# Patient Record
Sex: Male | Born: 1960 | Race: White | Hispanic: No | Marital: Married | Smoking: Never smoker
Health system: Southern US, Community
[De-identification: ages and names within clinical notes are randomized; demographics above are authoritative.]

## PROBLEM LIST (undated history)

## (undated) DIAGNOSIS — I10 Essential (primary) hypertension: Secondary | ICD-10-CM

## (undated) DIAGNOSIS — E119 Type 2 diabetes mellitus without complications: Secondary | ICD-10-CM

## (undated) DIAGNOSIS — I2510 Atherosclerotic heart disease of native coronary artery without angina pectoris: Secondary | ICD-10-CM

## (undated) DIAGNOSIS — K7460 Unspecified cirrhosis of liver: Secondary | ICD-10-CM

## (undated) HISTORY — DX: Essential (primary) hypertension: I10

## (undated) HISTORY — DX: Unspecified cirrhosis of liver: K74.60

## (undated) HISTORY — DX: Atherosclerotic heart disease of native coronary artery without angina pectoris: I25.10

## (undated) HISTORY — DX: Type 2 diabetes mellitus without complications: E11.9

---

## 2019-07-10 DIAGNOSIS — E113512 Type 2 diabetes mellitus with proliferative diabetic retinopathy with macular edema, left eye: Secondary | ICD-10-CM | POA: Diagnosis not present

## 2019-07-10 DIAGNOSIS — E113411 Type 2 diabetes mellitus with severe nonproliferative diabetic retinopathy with macular edema, right eye: Secondary | ICD-10-CM | POA: Diagnosis not present

## 2019-07-10 DIAGNOSIS — H4912 Fourth [trochlear] nerve palsy, left eye: Secondary | ICD-10-CM | POA: Diagnosis not present

## 2019-07-10 DIAGNOSIS — H4922 Sixth [abducent] nerve palsy, left eye: Secondary | ICD-10-CM | POA: Diagnosis not present

## 2019-07-13 NOTE — Progress Notes (Signed)
Cardiology Office Note:    Date:  07/14/2019   ID:  Corey Silva, DOB [birth year], MRN [id number]  PCP:  Verda Cumins, MD  Cardiologist:  No primary care provider on file.  Electrophysiologist:  None   Referring MD: Verda Cumins, MD   Chief Complaint  Patient presents with  . Chest Pain    History of Present Illness:    Corey Silva is a 59 y.o. male with a hx of CAD, cirrhosis, type 2 diabetes, hypertension,hyperlipidemia who is referred by Dr. Elberta Spaniel for evaluation of chest pain.  He has had 3 stents placed, most recently at the [place] in [place], [place] in 2016/2017.  He reports he was told that he also had an artery that was too small to stent and medical management was recommended.  He states he has always had some chest pain but recently has been worsening.  States that he is having chest pain 1-2 times per week.  Can occur at rest or with exertion.  States that it is left-sided pain, describes as a burning sensation but sometimes with pressure.  Reports it is more intense than he has been having in the past.  Last 5 to 20 minutes and resolved.  He has nitroglycerin but does not take because he has had severe headaches with it.  States that he has not taken nitroglycerin for 16 years.  He is on aspirin and Plavix.  He is also inquiring on a procedure, as was being evaluated for penile pump and would need to hold the Plavix for 14 days.  Past Medical History:  Diagnosis Date  . Cirrhosis ([place])   . Coronary artery disease   . Diabetes mellitus without complication ([place])   . Hypertension       Current Medications: Current Meds  Medication Sig  . aspirin EC 81 MG tablet Take 81 mg by mouth daily.  . busPIRone (BUSPAR) 10 MG tablet Take 10 mg by mouth 2 (two) times daily. 2 tablets by mouth twice daily  . clopidogrel (PLAVIX) 75 MG tablet Take 75 mg by mouth daily. 1 tablet daily  . DULoxetine (CYMBALTA) 60 MG capsule Take 60 mg by mouth daily. Take 2 capsules daily  . gabapentin  (NEURONTIN) 300 MG capsule Take 300 mg by mouth. 4 tablets 3 times daily  . hydrochlorothiazide (HYDRODIURIL) 25 MG tablet Take 25 mg by mouth daily.  . insulin regular (NOVOLIN R) 100 units/mL injection Inject 35 Units into the skin 3 (three) times daily before meals.  Marland Kitchen losartan (COZAAR) 100 MG tablet Take 100 mg by mouth daily.  . metFORMIN (GLUCOPHAGE-XR) 500 MG 24 hr tablet Take 2,000 mg by mouth daily with breakfast.  . omeprazole (PRILOSEC) 20 MG capsule Take 20 mg by mouth daily.  . pregabalin (LYRICA) 225 MG capsule Take 225 mg by mouth 2 (two) times daily. 1 tablet twice dailt  . rosuvastatin (CRESTOR) 40 MG tablet Take 20 mg by mouth daily.  Marland Kitchen SEMAGLUTIDE, 1 MG/DOSE, [place] Inject into the skin.  . Vitamin D, Cholecalciferol, 25 MCG (1000 UT) TABS Take by mouth.  . [DISCONTINUED] losartan-hydrochlorothiazide (HYZAAR) 100-12.5 MG tablet Take 1 tablet by mouth daily. 1 tablet daily     Allergies:   Patient has no allergy information on record.   Social History   Socioeconomic History  . Marital status: Married    Spouse name: Not on file  . Number of children: Not on file  . Years of education: Not on file  .  Highest education level: Not on file  Occupational History  . Not on file  Tobacco Use  . Smoking status: Not on file  Substance and Sexual Activity  . Alcohol use: Not on file  . Drug use: Not on file  . Sexual activity: Not on file  Other Topics Concern  . Not on file  Social History Narrative  . Not on file   Social Determinants of Health   Financial Resource Strain:   . Difficulty of Paying Living Expenses: Not on file  Food Insecurity:   . Worried About Programme researcher, broadcasting/film/video in the Last Year: Not on file  . Ran Out of Food in the Last Year: Not on file  Transportation Needs:   . Lack of Transportation (Medical): Not on file  . Lack of Transportation (Non-Medical): Not on file  Physical Activity:   . Days of Exercise per Week: Not on file  . Minutes of  Exercise per Session: Not on file  Stress:   . Feeling of Stress : Not on file  Social Connections:   . Frequency of Communication with Friends and Family: Not on file  . Frequency of Social Gatherings with Friends and Family: Not on file  . Attends Religious Services: Not on file  . Active Member of Clubs or Organizations: Not on file  . Attends Banker Meetings: Not on file  . Marital Status: Not on file     Family History: No relevant family history ROS:   Please see the history of present illness.     All other systems reviewed and are negative.  EKGs/Labs/Other Studies Reviewed:    The following studies were reviewed today:  EKG:  EKG is ordered today.  The ekg ordered today demonstrates normal sinus rhythm, LVH, rate 90, nonspecific T wave flattening.  Recent Labs: No results found for requested labs within last 8760 hours.  Recent Lipid Panel No results found for: CHOL, TRIG, HDL, CHOLHDL, VLDL, LDLCALC, LDLDIRECT  Physical Exam:    VS:  BP (!) 142/76   Pulse 90   Temp 98.4 F (36.9 C)   Ht 6\' 1"  (1.854 m)   Wt 262 lb 12.8 oz (119.2 kg)   SpO2 95%   BMI 34.67 kg/m     Wt Readings from Last 3 Encounters:  07/14/19 262 lb 12.8 oz (119.2 kg)     GEN:  Well nourished, well developed in no acute distress HEENT: Normal NECK: No JVD LYMPHATICS: No lymphadenopathy CARDIAC: RRR, 2/6 systolic murmur RESPIRATORY:  Clear to auscultation without rales, wheezing or rhonchi  ABDOMEN: Soft, non-tender, non-distended MUSCULOSKELETAL:  No edema; No deformity  SKIN: Warm and dry NEUROLOGIC:  Alert and oriented x 3 PSYCHIATRIC:  Normal affect   ASSESSMENT:    1. Chest pain, unspecified type   2. 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 Essential hypertension   4. Hyperlipidemia, unspecified hyperlipidemia type    PLAN:    In order of problems listed above:  CAD: Status post 3 stents.   Will obtain records from last catheterization in 09/13/19.  Currently having atypical angina. -Lexiscan Myoview -Continue ASA 81 mg,Plavix 75 mg daily, rosuvastatin 20 mg daily -We will add metoprolol 25 mg twice daily -SL NTG  Pre-op evaluation:prior to penile pump, was told would need to hold Plavix for 14 days prior to procedure.  Given active chest pain as above, would hold off on procedure until work-up  is complete.  We will [facility] for [facility] as above.  If no significant perfusion defect on Myoview, can hold Plavix and proceed with procedure.  Given his history of coronary stents,  Aspirin 81 mg should not be held for the procedure.  Hypertension: On hydrochlorothiazide 25 mg, losartan 100 mg daily.  Mildly elevated BP in clinic today, will add metoprolol as above  Type 2 diabetes: On insulin.  A1c 6.9 on 06/04/2019  Hyperlipidemia: On rosuvastatin 20 mg daily.  LDL 44 on 05/29/2019  RTC in 1 month  Medication Adjustments/Labs and Tests Ordered: Current medicines are reviewed at length with the patient today.  Concerns regarding medicines are outlined above.  Orders Placed This Encounter  Procedures  . MYOCARDIAL PERFUSION IMAGING  . EKG 12-Lead  . ECHOCARDIOGRAM COMPLETE   Meds ordered this encounter  Medications  . metoprolol tartrate (LOPRESSOR) 25 MG tablet    Sig: Take 1 tablet (25 mg total) by mouth 2 (two) times daily.    Dispense:  180 tablet    Refill:  3    Patient Instructions  Medication Instructions:  START metoprolol tartrate (Lopressor) 25 mg two times daily  *If you need a refill on your cardiac medications before your next appointment, please call your pharmacy*   Lab Work: NONE  Testing/Procedures: Your physician has requested that you have an echocardiogram. Echocardiography is a painless test that uses sound waves to create images of your heart. It provides your doctor with information about the size and shape of your heart and how well your  heart's chambers and valves are working. This procedure takes approximately one hour. There are no restrictions for this procedure.  This will be done at our [facility] location:  [address]  Your physician has requested that you have a lexiscan myoview. For further information please visit [email or web address] Please follow instruction sheet, as given.   Follow-Up: At [facility], you and your health needs are our priority.  As part of our continuing mission to provide you with exceptional heart care, we have created designated Provider Care Teams.  These Care Teams include your primary Cardiologist (physician) and Advanced Practice Providers (APPs -  Physician Assistants and Nurse Practitioners) who all work together to provide you with the care you need, when you need it.  We recommend signing up for the patient portal called "MyChart".  Sign up information is provided on this After Visit Summary.  MyChart is used to connect with patients for Virtual Visits (Telemedicine).  Patients are able to view lab/test results, encounter notes, upcoming appointments, etc.  Non-urgent messages can be sent to your provider as well.   To learn more about what you can do with MyChart, go to [email or web address].    Your next appointment:   1 month(s)  The format for your next appointment:   In Person  Provider:   Epifanio Lesches, MD        Signed, Little Ishikawa, MD  07/14/2019 11:04 AM    [facility]

## 2019-07-14 ENCOUNTER — Other Ambulatory Visit: Payer: Self-pay

## 2019-07-14 ENCOUNTER — Encounter: Payer: Self-pay | Admitting: Cardiology

## 2019-07-14 ENCOUNTER — Ambulatory Visit (INDEPENDENT_AMBULATORY_CARE_PROVIDER_SITE_OTHER): Payer: No Typology Code available for payment source | Admitting: Cardiology

## 2019-07-14 VITALS — BP 142/76 | HR 90 | Temp 98.4°F | Ht 73.0 in | Wt 262.8 lb

## 2019-07-14 DIAGNOSIS — I10 Essential (primary) hypertension: Secondary | ICD-10-CM | POA: Diagnosis not present

## 2019-07-14 DIAGNOSIS — R079 Chest pain, unspecified: Secondary | ICD-10-CM

## 2019-07-14 DIAGNOSIS — I25119 Atherosclerotic heart disease of native coronary artery with unspecified angina pectoris: Secondary | ICD-10-CM

## 2019-07-14 DIAGNOSIS — E785 Hyperlipidemia, unspecified: Secondary | ICD-10-CM

## 2019-07-14 MED ORDER — METOPROLOL TARTRATE 25 MG PO TABS: 25.0000 mg | ORAL_TABLET | Freq: Two times a day (BID) | ORAL | 3 refills | Status: DC

## 2019-07-14 NOTE — Patient Instructions (Signed)
Medication Instructions:  START metoprolol tartrate (Lopressor) 25 mg two times daily  *If you need a refill on your cardiac medications before your next appointment, please call your pharmacy*   Lab Work: NONE  Testing/Procedures: Your physician has requested that you have an echocardiogram. Echocardiography is a painless test that uses sound waves to create images of your heart. It provides your doctor with information about the size and shape of your heart and how well your heart's chambers and valves are working. This procedure takes approximately one hour. There are no restrictions for this procedure.  This will be done at our [facility] location:  [address]  Your physician has requested that you have a lexiscan myoview. For further information please visit [email or web address] Please follow instruction sheet, as given.   Follow-Up: At [facility], you and your health needs are our priority.  As part of our continuing mission to provide you with exceptional heart care, we have created designated Provider Care Teams.  These Care Teams include your primary Cardiologist (physician) and Advanced Practice Providers (APPs -  Physician Assistants and Nurse Practitioners) who all work together to provide you with the care you need, when you need it.  We recommend signing up for the patient portal called "MyChart".  Sign up information is provided on this After Visit Summary.  MyChart is used to connect with patients for Virtual Visits (Telemedicine).  Patients are able to view lab/test results, encounter notes, upcoming appointments, etc.  Non-urgent messages can be sent to your provider as well.   To learn more about what you can do with MyChart, go to [email or web address].    Your next appointment:   1 month(s)  The format for your next appointment:   In Person  Provider:   Epifanio Lesches, MD

## 2019-07-18 ENCOUNTER — Telehealth (HOSPITAL_COMMUNITY): Payer: Self-pay

## 2019-07-18 NOTE — Telephone Encounter (Signed)
Encounter complete. 

## 2019-07-23 ENCOUNTER — Telehealth: Payer: Self-pay | Admitting: Cardiology

## 2019-07-23 ENCOUNTER — Other Ambulatory Visit: Payer: Self-pay

## 2019-07-23 ENCOUNTER — Ambulatory Visit (HOSPITAL_COMMUNITY)
Admission: RE | Admit: 2019-07-23 | Discharge: 2019-07-23 | Disposition: A | Discharge status: Home / Self Care | Payer: No Typology Code available for payment source | Source: Ambulatory Visit | Attending: Cardiovascular Disease | Admitting: Cardiovascular Disease

## 2019-07-23 DIAGNOSIS — R079 Chest pain, unspecified: Secondary | ICD-10-CM | POA: Diagnosis not present

## 2019-07-23 LAB — MYOCARDIAL PERFUSION IMAGING
LV dias vol: 116 mL (ref 62–150)
LV sys vol: 52 mL
Peak HR: 104 {beats}/min
Rest HR: 78 {beats}/min
SDS: 4
SRS: 2
SSS: 5
TID: 1.1

## 2019-07-23 MED ORDER — TECHNETIUM TC 99M TETROFOSMIN IV KIT
10.1000 | PACK | Freq: Once | INTRAVENOUS | Status: AC | PRN
  Administered 2019-07-23: 10.1 via INTRAVENOUS
  Filled 2019-07-23: qty 11

## 2019-07-23 MED ORDER — REGADENOSON 0.4 MG/5ML IV SOLN
0.4000 mg | Freq: Once | INTRAVENOUS | Status: AC
  Administered 2019-07-23: 0.4 mg via INTRAVENOUS

## 2019-07-23 MED ORDER — TECHNETIUM TC 99M TETROFOSMIN IV KIT
31.6000 | PACK | Freq: Once | INTRAVENOUS | Status: AC | PRN
  Administered 2019-07-23: 31.6 via INTRAVENOUS
  Filled 2019-07-23: qty 32

## 2019-07-23 NOTE — Telephone Encounter (Signed)
Encounter not needed

## 2019-07-23 NOTE — Telephone Encounter (Signed)
Returned call to patient no answer.LMTC. 

## 2019-07-23 NOTE — Telephone Encounter (Signed)
Patient states he is calling to discuss instructions for Myocardial Perfusion scheduled for today, 07/23/19 at 1:15 PM. Please return call to advise.

## 2019-07-24 ENCOUNTER — Telehealth: Payer: Self-pay | Admitting: Cardiology

## 2019-07-24 NOTE — Telephone Encounter (Signed)
Patient returning Hayley's call in regards to stress test results. Please call mobile number.

## 2019-07-25 NOTE — Telephone Encounter (Signed)
See result note-OV 3/26

## 2019-07-28 NOTE — Progress Notes (Addendum)
Cardiology Office Note:    Date:  08/04/2019   ID:  Corey Silva, DOB [birth year], MRN [id number]  PCP:  Erick Alley, MD  Cardiologist:  No primary care provider on file.  Electrophysiologist:  None   Referring MD: Erick Alley, MD   Chief Complaint  Patient presents with  . Chest Pain    History of Present Illness:    Corey Silva is a 59 y.o. male with a hx of CAD, cirrhosis, type 2 diabetes, hypertension,hyperlipidemia who is referred by Dr. Nelva Nay for evaluation of chest pain.  He has had 3 stents placed, most recently at the [place] in [place], [place] in 2016/2017.  He reports he was told that he also had an artery that was too small to stent and medical management was recommended.  He states he has always had some chest pain but recently has been worsening.  States that he is having chest pain 1-2 times per week.  Can occur at rest or with exertion.  States that it is left-sided pain, describes as a burning sensation but sometimes with pressure.  Reports it is more intense than he has been having in the past.  Last 5 to 20 minutes and resolved.  He has nitroglycerin but does not take because he has had severe headaches with it.  States that he has not taken nitroglycerin for 16 years.  He is on aspirin and Plavix.  He is also inquiring on a procedure, as was being evaluated for penile pump and would need to hold the Plavix for 14 days.  Cath 11/2013 showed 60% in-stent restenosis in proximal LAD, 20% in-stent restenosis in D1, 90% stenosis in distal RCA.  S/p DES to distal RCA.  Lexiscan Myoview on 07/23/2019 showed basal to mid anterior ischemia.  Since last clinic visit, continues to have chest pain 1-2 times per week, no changes since prior visit.   Past Medical History:  Diagnosis Date  . Cirrhosis (HCC)   . Coronary artery disease   . Diabetes mellitus without complication (HCC)   . Hypertension       Current Medications: Current Meds  Medication Sig  . aspirin EC 81 MG  tablet Take 81 mg by mouth daily.  . busPIRone (BUSPAR) 10 MG tablet Take 10 mg by mouth 2 (two) times daily. 2 tablets by mouth twice daily  . clopidogrel (PLAVIX) 75 MG tablet Take 75 mg by mouth daily. 1 tablet daily  . DULoxetine (CYMBALTA) 60 MG capsule Take 60 mg by mouth daily. Take 2 capsules daily  . gabapentin (NEURONTIN) 300 MG capsule Take 300 mg by mouth. 4 tablets 3 times daily  . hydrochlorothiazide (HYDRODIURIL) 25 MG tablet Take 25 mg by mouth daily.  . insulin regular (NOVOLIN R) 100 units/mL injection Inject 35 Units into the skin 3 (three) times daily before meals.  Marland Kitchen losartan (COZAAR) 100 MG tablet Take 100 mg by mouth daily.  . metFORMIN (GLUCOPHAGE-XR) 500 MG 24 hr tablet Take 2,000 mg by mouth daily with breakfast.  . metoprolol tartrate (LOPRESSOR) 50 MG tablet Take 1 tablet (50 mg total) by mouth 2 (two) times daily.  Marland Kitchen omeprazole (PRILOSEC) 20 MG capsule Take 20 mg by mouth daily.  . pregabalin (LYRICA) 225 MG capsule Take 225 mg by mouth 2 (two) times daily. 1 tablet twice dailt  . rosuvastatin (CRESTOR) 40 MG tablet Take 20 mg by mouth daily.  Marland Kitchen SEMAGLUTIDE, 1 MG/DOSE, [place] Inject into the skin.  . Vitamin D, Cholecalciferol, 25  MCG (1000 UT) TABS Take by mouth.  . [DISCONTINUED] metoprolol tartrate (LOPRESSOR) 25 MG tablet Take 1 tablet (25 mg total) by mouth 2 (two) times daily.     Allergies:   Sm non-asprin sinus [pseudoephedrine-acetaminophen]   Social History   Socioeconomic History  . Marital status: Married    Spouse name: Not on file  . Number of children: Not on file  . Years of education: Not on file  . Highest education level: Not on file  Occupational History  . Not on file  Tobacco Use  . Smoking status: Never Smoker  . Smokeless tobacco: Never Used  Substance and Sexual Activity  . Alcohol use: Not Currently  . Drug use: Never  . Sexual activity: Yes  Other Topics Concern  . Not on file  Social History Narrative  . Not on file    Social Determinants of Health   Financial Resource Strain:   . Difficulty of Paying Living Expenses:   Food Insecurity:   . Worried About Programme researcher, broadcasting/film/video in the Last Year:   . Barista in the Last Year:   Transportation Needs:   . Freight forwarder (Medical):   Marland Kitchen Lack of Transportation (Non-Medical):   Physical Activity:   . Days of Exercise per Week:   . Minutes of Exercise per Session:   Stress:   . Feeling of Stress :   Social Connections:   . Frequency of Communication with Friends and Family:   . Frequency of Social Gatherings with Friends and Family:   . Attends Religious Services:   . Active Member of Clubs or Organizations:   . Attends Banker Meetings:   Marland Kitchen Marital Status:      Family History: No relevant family history ROS:   Please see the history of present illness.     All other systems reviewed and are negative.  EKGs/Labs/Other Studies Reviewed:    The following studies were reviewed today:  EKG:  EKG is ordered today.  The ekg ordered today demonstrates normal sinus rhythm, LVH, rate 77, nonspecific T wave flattening.  Lexiscan Myoview 07/23/2019:  The left ventricular ejection fraction is normal (55-65%).  Nuclear stress EF: 55%.  There was no ST segment deviation noted during stress.  No T wave inversion was noted during stress.  Findings consistent with ischemia.  This is an intermediate risk study.   Impression:  1. Diaphragm attenuation artifact.  2. There is a medium size (6-8%), moderate perfusion defect present in the basal to mid anterior wall consistent with ischemia.  3. No evidence of prior infarction.  4. Normal LVEF, 55%.  5. This is an intermediate risk study given the patient's history of CAD/PCI.   Recent Labs: 08/01/2019: BUN 26; Creatinine, Ser 0.85; Hemoglobin 15.3; Platelets 150; Potassium 4.5; Sodium 140  Recent Lipid Panel No results found for: CHOL, TRIG, HDL, CHOLHDL, VLDL, LDLCALC,  LDLDIRECT  Physical Exam:    VS:  BP 134/74   Pulse 77   Temp (!) 97.2 F (36.2 C)   Ht 6\' 1"  (1.854 m)   Wt 256 lb (116.1 kg)   SpO2 95%   BMI 33.78 kg/m     Wt Readings from Last 3 Encounters:  08/01/19 256 lb (116.1 kg)  07/23/19 262 lb (118.8 kg)  07/14/19 262 lb 12.8 oz (119.2 kg)     GEN:  Well nourished, well developed in no acute distress HEENT: Normal NECK: No JVD LYMPHATICS: No lymphadenopathy CARDIAC:  RRR, 2/6 systolic murmur RESPIRATORY:  Clear to auscultation without rales, wheezing or rhonchi  ABDOMEN: Soft, non-tender, non-distended MUSCULOSKELETAL:  No edema; No deformity  SKIN: Warm and dry NEUROLOGIC:  Alert and oriented x 3 PSYCHIATRIC:  Normal affect   ASSESSMENT:    1. Coronary artery disease involving native coronary artery of native heart with angina pectoris ([place])   2. Abnormal nuclear stress test   3. Pre-procedure lab exam   4. Essential hypertension   5. Hyperlipidemia, unspecified hyperlipidemia type    PLAN:     CAD: Has had 3 stents, most recent cath in [place] showed  60% in-stent restenosis in proximal LAD, 20% in-stent restenosis in D1, 90% stenosis in distal RCA s/p DES to distal RCA.  Currently having atypical angina.  Lexiscan Myoview 07/23/2019 shows anterior ischemia -Continue ASA 81 mg,Plavix 75 mg daily, rosuvastatin 20 mg daily -Increase metoprolol to 50 mg twice daily -SL NTG prn -Recommend LHC.  Risks and benefits of cardiac catheterization have been discussed with the patient.  These include bleeding, infection, kidney damage, stroke, heart attack, death.  The patient understands these risks and is willing to proceed.  Hypertension: appears controlled, continue current meds  Type 2 diabetes: On insulin.  A1c 6.9 on 06/04/2019  Hyperlipidemia: On rosuvastatin 20 mg daily.  LDL 44 on 05/29/2019  RTC in 1 month  Medication Adjustments/Labs and Tests Ordered: Current medicines are reviewed at length with the patient  today.  Concerns regarding medicines are outlined above.  Orders Placed This Encounter  Procedures  . Basic metabolic panel  . CBC   Meds ordered this encounter  Medications  . metoprolol tartrate (LOPRESSOR) 50 MG tablet    Sig: Take 1 tablet (50 mg total) by mouth 2 (two) times daily.    Dispense:  180 tablet    Refill:  3  . nitroGLYCERIN (NITROSTAT) 0.4 MG SL tablet    Sig: Place 1 tablet (0.4 mg total) under the tongue every 5 (five) minutes as needed for chest pain.    Dispense:  25 tablet    Refill:  3    Patient Instructions  Medication Instructions:  INCREASE metoprolol tartrate (Lopressor) to 50 mg two times daily  Take sublingual NTG as needed for chest pain  *If you need a refill on your cardiac medications before your next appointment, please call your pharmacy*   Lab Work: BMET, CBC today If you have labs (blood work) drawn today and your tests are completely normal, you will receive your results only by: Marland Kitchen MyChart Message (if you have MyChart) OR . A paper copy in the mail If you have any lab test that is abnormal or we need to change your treatment, we will call you to review the results.  COVID TEST TOMORROW 3/27 at 9 AM at [facility]  Testing/Procedures: Your physician has requested that you have a cardiac catheterization. Cardiac catheterization is used to diagnose and/or treat various heart conditions. Doctors may recommend this procedure for a number of different reasons. The most common reason is to evaluate chest pain. Chest pain can be a symptom of coronary artery disease (CAD), and cardiac catheterization can show whether plaque is narrowing or blocking your heart's arteries. This procedure is also used to evaluate the valves, as well as measure the blood flow and oxygen levels in different parts of your heart. For further information please visit [email or web address]. Please follow instruction sheet, as given.   Follow-Up: At [facility], you and  your health  needs are our priority.  As part of our continuing mission to provide you with exceptional heart care, we have created designated Provider Care Teams.  These Care Teams include your primary Cardiologist (physician) and Advanced Practice Providers (APPs -  Physician Assistants and Nurse Practitioners) who all work together to provide you with the care you need, when you need it.  We recommend signing up for the patient portal called "MyChart".  Sign up information is provided on this After Visit Summary.  MyChart is used to connect with patients for Virtual Visits (Telemedicine).  Patients are able to view lab/test results, encounter notes, upcoming appointments, etc.  Non-urgent messages can be sent to your provider as well.   To learn more about what you can do with MyChart, go to [email or web address].    Your next appointment:   As scheduled 4/13 with Dr. Bjorn Pippin    Signed, Little Ishikawa, MD  08/04/2019 8:47 AM     [facility]

## 2019-07-28 NOTE — H&P (View-Only) (Signed)
Cardiology Office Note:    Date:  08/04/2019   ID:  Corey Silva, DOB 06-02-1960, MRN [id number]  PCP:  Erick Alley, MD  Cardiologist:  No primary care provider on file.  Electrophysiologist:  None   Referring MD: Erick Alley, MD   Chief Complaint  Patient presents with  . Chest Pain    History of Present Illness:    Corey Silva is a 59 y.o. male with a hx of CAD, cirrhosis, type 2 diabetes, hypertension,hyperlipidemia who is referred by Dr. Nelva Nay for evaluation of chest pain.  He has had 3 stents placed, most recently at the [place] in [place], [place] in 2016/2017.  He reports he was told that he also had an artery that was too small to stent and medical management was recommended.  He states he has always had some chest pain but recently has been worsening.  States that he is having chest pain 1-2 times per week.  Can occur at rest or with exertion.  States that it is left-sided pain, describes as a burning sensation but sometimes with pressure.  Reports it is more intense than he has been having in the past.  Last 5 to 20 minutes and resolved.  He has nitroglycerin but does not take because he has had severe headaches with it.  States that he has not taken nitroglycerin for 16 years.  He is on aspirin and Plavix.  He is also inquiring on a procedure, as was being evaluated for penile pump and would need to hold the Plavix for 14 days.  Cath 11/2013 showed 60% in-stent restenosis in proximal LAD, 20% in-stent restenosis in D1, 90% stenosis in distal RCA.  S/p DES to distal RCA.  Lexiscan Myoview on 07/23/2019 showed basal to mid anterior ischemia.  Since last clinic visit, continues to have chest pain 1-2 times per week, no changes since prior visit.   Past Medical History:  Diagnosis Date  . Cirrhosis (HCC)   . Coronary artery disease   . Diabetes mellitus without complication (HCC)   . Hypertension       Current Medications: Current Meds  Medication Sig  . aspirin EC 81 MG  tablet Take 81 mg by mouth daily.  . busPIRone (BUSPAR) 10 MG tablet Take 10 mg by mouth 2 (two) times daily. 2 tablets by mouth twice daily  . clopidogrel (PLAVIX) 75 MG tablet Take 75 mg by mouth daily. 1 tablet daily  . DULoxetine (CYMBALTA) 60 MG capsule Take 60 mg by mouth daily. Take 2 capsules daily  . gabapentin (NEURONTIN) 300 MG capsule Take 300 mg by mouth. 4 tablets 3 times daily  . hydrochlorothiazide (HYDRODIURIL) 25 MG tablet Take 25 mg by mouth daily.  . insulin regular (NOVOLIN R) 100 units/mL injection Inject 35 Units into the skin 3 (three) times daily before meals.  Marland Kitchen losartan (COZAAR) 100 MG tablet Take 100 mg by mouth daily.  . metFORMIN (GLUCOPHAGE-XR) 500 MG 24 hr tablet Take 2,000 mg by mouth daily with breakfast.  . metoprolol tartrate (LOPRESSOR) 50 MG tablet Take 1 tablet (50 mg total) by mouth 2 (two) times daily.  Marland Kitchen omeprazole (PRILOSEC) 20 MG capsule Take 20 mg by mouth daily.  . pregabalin (LYRICA) 225 MG capsule Take 225 mg by mouth 2 (two) times daily. 1 tablet twice dailt  . rosuvastatin (CRESTOR) 40 MG tablet Take 20 mg by mouth daily.  Marland Kitchen SEMAGLUTIDE, 1 MG/DOSE, [place] Inject into the skin.  . Vitamin D, Cholecalciferol, 25  MCG (1000 UT) TABS Take by mouth.  . [DISCONTINUED] metoprolol tartrate (LOPRESSOR) 25 MG tablet Take 1 tablet (25 mg total) by mouth 2 (two) times daily.     Allergies:   Sm non-asprin sinus [pseudoephedrine-acetaminophen]   Social History   Socioeconomic History  . Marital status: Married    Spouse name: Not on file  . Number of children: Not on file  . Years of education: Not on file  . Highest education level: Not on file  Occupational History  . Not on file  Tobacco Use  . Smoking status: Never Smoker  . Smokeless tobacco: Never Used  Substance and Sexual Activity  . Alcohol use: Not Currently  . Drug use: Never  . Sexual activity: Yes  Other Topics Concern  . Not on file  Social History Narrative  . Not on file    Social Determinants of Health   Financial Resource Strain:   . Difficulty of Paying Living Expenses:   Food Insecurity:   . Worried About Charity fundraiser in the Last Year:   . Arboriculturist in the Last Year:   Transportation Needs:   . Film/video editor (Medical):   Marland Kitchen Lack of Transportation (Non-Medical):   Physical Activity:   . Days of Exercise per Week:   . Minutes of Exercise per Session:   Stress:   . Feeling of Stress :   Social Connections:   . Frequency of Communication with Friends and Family:   . Frequency of Social Gatherings with Friends and Family:   . Attends Religious Services:   . Active Member of Clubs or Organizations:   . Attends Archivist Meetings:   Marland Kitchen Marital Status:      Family History: No relevant family history ROS:   Please see the history of present illness.     All other systems reviewed and are negative.  EKGs/Labs/Other Studies Reviewed:    The following studies were reviewed today:  EKG:  EKG is ordered today.  The ekg ordered today demonstrates normal sinus rhythm, LVH, rate 90, nonspecific T wave flattening.  Lexiscan Myoview 07/23/2019:  The left ventricular ejection fraction is normal (55-65%).  Nuclear stress EF: 55%.  There was no ST segment deviation noted during stress.  No T wave inversion was noted during stress.  Findings consistent with ischemia.  This is an intermediate risk study.   Impression:  1. Diaphragm attenuation artifact.  2. There is a medium size (6-8%), moderate perfusion defect present in the basal to mid anterior wall consistent with ischemia.  3. No evidence of prior infarction.  4. Normal LVEF, 55%.  5. This is an intermediate risk study given the patient's history of CAD/PCI.   Recent Labs: 08/01/2019: BUN 26; Creatinine, Ser 0.85; Hemoglobin 15.3; Platelets 150; Potassium 4.5; Sodium 140  Recent Lipid Panel No results found for: CHOL, TRIG, HDL, CHOLHDL, VLDL, LDLCALC,  LDLDIRECT  Physical Exam:    VS:  BP 134/74   Pulse 77   Temp (!) 97.2 F (36.2 C)   Ht 6\' 1"  (1.854 m)   Wt 256 lb (116.1 kg)   SpO2 95%   BMI 33.78 kg/m     Wt Readings from Last 3 Encounters:  08/01/19 256 lb (116.1 kg)  07/23/19 262 lb (118.8 kg)  07/14/19 262 lb 12.8 oz (119.2 kg)     GEN:  Well nourished, well developed in no acute distress HEENT: Normal NECK: No JVD LYMPHATICS: No lymphadenopathy CARDIAC:  RRR, 2/6 systolic murmur RESPIRATORY:  Clear to auscultation without rales, wheezing or rhonchi  ABDOMEN: Soft, non-tender, non-distended MUSCULOSKELETAL:  No edema; No deformity  SKIN: Warm and dry NEUROLOGIC:  Alert and oriented x 3 PSYCHIATRIC:  Normal affect   ASSESSMENT:    1. Coronary artery disease involving native coronary artery of native heart with angina pectoris ([place])   2. Abnormal nuclear stress test   3. Pre-procedure lab exam   4. Essential hypertension   5. Hyperlipidemia, unspecified hyperlipidemia type    PLAN:     CAD: Has had 3 stents, most recent cath in [place] showed  60% in-stent restenosis in proximal LAD, 20% in-stent restenosis in D1, 90% stenosis in distal RCA s/p DES to distal RCA.  Currently having atypical angina.  Lexiscan Myoview 07/23/2019 shows anterior ischemia -Continue ASA 81 mg,Plavix 75 mg daily, rosuvastatin 20 mg daily -Increase metoprolol to 50 mg twice daily -SL NTG prn -Recommend LHC.  Risks and benefits of cardiac catheterization have been discussed with the patient.  These include bleeding, infection, kidney damage, stroke, heart attack, death.  The patient understands these risks and is willing to proceed.  Hypertension: appears controlled, continue current meds  Type 2 diabetes: On insulin.  A1c 6.9 on 06/04/2019  Hyperlipidemia: On rosuvastatin 20 mg daily.  LDL 44 on 05/29/2019  RTC in 1 month  Medication Adjustments/Labs and Tests Ordered: Current medicines are reviewed at length with the patient  today.  Concerns regarding medicines are outlined above.  Orders Placed This Encounter  Procedures  . Basic metabolic panel  . CBC   Meds ordered this encounter  Medications  . metoprolol tartrate (LOPRESSOR) 50 MG tablet    Sig: Take 1 tablet (50 mg total) by mouth 2 (two) times daily.    Dispense:  180 tablet    Refill:  3  . nitroGLYCERIN (NITROSTAT) 0.4 MG SL tablet    Sig: Place 1 tablet (0.4 mg total) under the tongue every 5 (five) minutes as needed for chest pain.    Dispense:  25 tablet    Refill:  3    Patient Instructions  Medication Instructions:  INCREASE metoprolol tartrate (Lopressor) to 50 mg two times daily  Take sublingual NTG as needed for chest pain  *If you need a refill on your cardiac medications before your next appointment, please call your pharmacy*   Lab Work: BMET, CBC today If you have labs (blood work) drawn today and your tests are completely normal, you will receive your results only by: Marland Kitchen MyChart Message (if you have MyChart) OR . A paper copy in the mail If you have any lab test that is abnormal or we need to change your treatment, we will call you to review the results.  COVID TEST TOMORROW 3/27 at 9 AM at [facility]  Testing/Procedures: Your physician has requested that you have a cardiac catheterization. Cardiac catheterization is used to diagnose and/or treat various heart conditions. Doctors may recommend this procedure for a number of different reasons. The most common reason is to evaluate chest pain. Chest pain can be a symptom of coronary artery disease (CAD), and cardiac catheterization can show whether plaque is narrowing or blocking your heart's arteries. This procedure is also used to evaluate the valves, as well as measure the blood flow and oxygen levels in different parts of your heart. For further information please visit [email or web address]. Please follow instruction sheet, as given.   Follow-Up: At [facility], you and  your health  needs are our priority.  As part of our continuing mission to provide you with exceptional heart care, we have created designated Provider Care Teams.  These Care Teams include your primary Cardiologist (physician) and Advanced Practice Providers (APPs -  Physician Assistants and Nurse Practitioners) who all work together to provide you with the care you need, when you need it.  We recommend signing up for the patient portal called "MyChart".  Sign up information is provided on this After Visit Summary.  MyChart is used to connect with patients for Virtual Visits (Telemedicine).  Patients are able to view lab/test results, encounter notes, upcoming appointments, etc.  Non-urgent messages can be sent to your provider as well.   To learn more about what you can do with MyChart, go to [email or web address].    Your next appointment:   As scheduled 4/13 with Dr. Bjorn Pippin    Signed, Little Ishikawa, MD  08/04/2019 8:47 AM     [facility]

## 2019-07-30 ENCOUNTER — Other Ambulatory Visit: Payer: Self-pay

## 2019-07-30 ENCOUNTER — Ambulatory Visit (HOSPITAL_COMMUNITY): Payer: No Typology Code available for payment source | Attending: Cardiology

## 2019-07-30 DIAGNOSIS — R079 Chest pain, unspecified: Secondary | ICD-10-CM | POA: Diagnosis not present

## 2019-08-01 ENCOUNTER — Encounter: Payer: Self-pay | Admitting: Cardiology

## 2019-08-01 ENCOUNTER — Other Ambulatory Visit: Payer: Self-pay

## 2019-08-01 ENCOUNTER — Inpatient Hospital Stay (HOSPITAL_COMMUNITY): Admission: RE | Admit: 2019-08-01 | Payer: No Typology Code available for payment source | Source: Ambulatory Visit

## 2019-08-01 ENCOUNTER — Ambulatory Visit (INDEPENDENT_AMBULATORY_CARE_PROVIDER_SITE_OTHER): Payer: No Typology Code available for payment source | Admitting: Cardiology

## 2019-08-01 VITALS — BP 134/74 | HR 77 | Temp 97.2°F | Ht 73.0 in | Wt 256.0 lb

## 2019-08-01 DIAGNOSIS — I10 Essential (primary) hypertension: Secondary | ICD-10-CM | POA: Diagnosis not present

## 2019-08-01 DIAGNOSIS — R9439 Abnormal result of other cardiovascular function study: Secondary | ICD-10-CM | POA: Diagnosis not present

## 2019-08-01 DIAGNOSIS — Z01812 Encounter for preprocedural laboratory examination: Secondary | ICD-10-CM | POA: Diagnosis not present

## 2019-08-01 DIAGNOSIS — I25119 Atherosclerotic heart disease of native coronary artery with unspecified angina pectoris: Secondary | ICD-10-CM | POA: Diagnosis not present

## 2019-08-01 DIAGNOSIS — E785 Hyperlipidemia, unspecified: Secondary | ICD-10-CM

## 2019-08-01 MED ORDER — NITROGLYCERIN 0.4 MG SL SUBL: 0.4000 mg | SUBLINGUAL_TABLET | SUBLINGUAL | 3 refills | Status: DC | PRN

## 2019-08-01 MED ORDER — METOPROLOL TARTRATE 50 MG PO TABS: 50.0000 mg | ORAL_TABLET | Freq: Two times a day (BID) | ORAL | 3 refills | Status: DC

## 2019-08-01 NOTE — Patient Instructions (Addendum)
Medication Instructions:  INCREASE metoprolol tartrate (Lopressor) to 50 mg two times daily  Take sublingual NTG as needed for chest pain  *If you need a refill on your cardiac medications before your next appointment, please call your pharmacy*   Lab Work: BMET, CBC today If you have labs (blood work) drawn today and your tests are completely normal, you will receive your results only by: Marland Kitchen MyChart Message (if you have MyChart) OR . A paper copy in the mail If you have any lab test that is abnormal or we need to change your treatment, we will call you to review the results.  COVID TEST TOMORROW 3/27 at 9 AM at [facility]  Testing/Procedures: Your physician has requested that you have a cardiac catheterization. Cardiac catheterization is used to diagnose and/or treat various heart conditions. Doctors may recommend this procedure for a number of different reasons. The most common reason is to evaluate chest pain. Chest pain can be a symptom of coronary artery disease (CAD), and cardiac catheterization can show whether plaque is narrowing or blocking your heart's arteries. This procedure is also used to evaluate the valves, as well as measure the blood flow and oxygen levels in different parts of your heart. For further information please visit [email or web address] Please follow instruction sheet, as given.   Follow-Up: At [facility], you and your health needs are our priority.  As part of our continuing mission to provide you with exceptional heart care, we have created designated Provider Care Teams.  These Care Teams include your primary Cardiologist (physician) and Advanced Practice Providers (APPs -  Physician Assistants and Nurse Practitioners) who all work together to provide you with the care you need, when you need it.  We recommend signing up for the patient portal called "MyChart".  Sign up information is provided on this After Visit Summary.  MyChart is used to connect with  patients for Virtual Visits (Telemedicine).  Patients are able to view lab/test results, encounter notes, upcoming appointments, etc.  Non-urgent messages can be sent to your provider as well.   To learn more about what you can do with MyChart, go to [email or web address].    Your next appointment:   As scheduled 4/13 with Dr. Bjorn Pippin

## 2019-08-02 ENCOUNTER — Other Ambulatory Visit (HOSPITAL_COMMUNITY)
Admission: RE | Admit: 2019-08-02 | Discharge: 2019-08-02 | Disposition: A | Discharge status: Home / Self Care | Payer: No Typology Code available for payment source | Source: Ambulatory Visit | Attending: Cardiology | Admitting: Cardiology

## 2019-08-02 DIAGNOSIS — Z01812 Encounter for preprocedural laboratory examination: Secondary | ICD-10-CM | POA: Insufficient documentation

## 2019-08-02 DIAGNOSIS — Z20822 Contact with and (suspected) exposure to covid-19: Secondary | ICD-10-CM | POA: Diagnosis not present

## 2019-08-02 LAB — CBC
Hematocrit: 46.4 % (ref 37.5–51.0)
Hemoglobin: 15.3 g/dL (ref 13.0–17.7)
MCH: 28.8 pg (ref 26.6–33.0)
MCHC: 33 g/dL (ref 31.5–35.7)
MCV: 87 fL (ref 79–97)
Platelets: 150 10*3/uL (ref 150–450)
RBC: 5.31 x10E6/uL (ref 4.14–5.80)
RDW: 13.7 % (ref 11.6–15.4)
WBC: 8.7 10*3/uL (ref 3.4–10.8)

## 2019-08-02 LAB — BASIC METABOLIC PANEL
BUN/Creatinine Ratio: 31 — ABNORMAL HIGH (ref 9–20)
BUN: 26 mg/dL — ABNORMAL HIGH (ref 6–24)
CO2: 24 mmol/L (ref 20–29)
Calcium: 10.2 mg/dL (ref 8.7–10.2)
Chloride: 98 mmol/L (ref 96–106)
Creatinine, Ser: 0.85 mg/dL (ref 0.76–1.27)
GFR calc Af Amer: 110 mL/min/{1.73_m2} (ref >59)
GFR calc non Af Amer: 95 mL/min/{1.73_m2} (ref >59)
Glucose: 120 mg/dL — ABNORMAL HIGH (ref 65–99)
Potassium: 4.5 mmol/L (ref 3.5–5.2)
Sodium: 140 mmol/L (ref 134–144)

## 2019-08-02 LAB — SARS CORONAVIRUS 2 (TAT 6-24 HRS): SARS Coronavirus 2: NEGATIVE

## 2019-08-04 ENCOUNTER — Telehealth: Payer: Self-pay | Admitting: *Deleted

## 2019-08-04 NOTE — Telephone Encounter (Signed)
Pt contacted pre-catheterization scheduled at [facility] for: Tuesday August 05, 2019 10:30 AM Verified arrival time and place: [facility] Main Entrance A [facility]) at: 8:30 AM   No solid food after midnight prior to cath, clear liquids until 5 AM day of procedure. Contrast allergy: no  Hold: Insulin -AM of procedure. Pt states he does not take HS Insulin. Metformin-day of procedure and 48 hours post procedure. HCTZ-AM of procedure.  Except hold medications AM meds can be  taken pre-cath with sip of water including: ASA 81 mg Plavix 75 mg   Confirmed patient has responsible adult to drive home post procedure and observe 24 hours after arriving home: yes  Currently, due to Covid-19 pandemic, only one person will be allowed with patient. Must be the same person for patient's entire stay and will be required to wear a mask. They will be asked to wait in the waiting room for the duration of the patient's stay.  Patients are required to wear a mask when they enter the hospital.     COVID-19 Pre-Screening Questions:  . In the past 7 to 10 days have you had a cough,  shortness of breath, headache, congestion, fever (100 or greater) body aches, chills, sore throat, or sudden loss of taste or sense of smell? no . Have you been around anyone with known Covid 19 in the past 7-10 days? no . Have you been around anyone who is awaiting Covid 19 test results in the past 7 to 10 days? no . Have you been around anyone who has been exposed to Covid 19, or has mentioned symptoms of Covid 19 within the past 7 to 10 days? no  I reviewed procedure/mask/visitor instructions, COVID-19 screening questions with patient.

## 2019-08-05 ENCOUNTER — Encounter (HOSPITAL_COMMUNITY)
Admission: RE | Disposition: A | Discharge status: Home / Self Care | Payer: Self-pay | Source: Home / Self Care | Attending: Cardiology

## 2019-08-05 ENCOUNTER — Other Ambulatory Visit: Payer: Self-pay

## 2019-08-05 ENCOUNTER — Ambulatory Visit (HOSPITAL_COMMUNITY)
Admission: RE | Admit: 2019-08-05 | Discharge: 2019-08-05 | Disposition: A | Discharge status: Home / Self Care | Payer: No Typology Code available for payment source | Attending: Cardiology | Admitting: Cardiology

## 2019-08-05 ENCOUNTER — Encounter: Payer: Self-pay | Admitting: Cardiology

## 2019-08-05 DIAGNOSIS — I25119 Atherosclerotic heart disease of native coronary artery with unspecified angina pectoris: Secondary | ICD-10-CM | POA: Insufficient documentation

## 2019-08-05 DIAGNOSIS — E119 Type 2 diabetes mellitus without complications: Secondary | ICD-10-CM | POA: Insufficient documentation

## 2019-08-05 DIAGNOSIS — Z794 Long term (current) use of insulin: Secondary | ICD-10-CM | POA: Insufficient documentation

## 2019-08-05 DIAGNOSIS — I10 Essential (primary) hypertension: Secondary | ICD-10-CM | POA: Diagnosis present

## 2019-08-05 DIAGNOSIS — I209 Angina pectoris, unspecified: Secondary | ICD-10-CM | POA: Diagnosis present

## 2019-08-05 DIAGNOSIS — Z955 Presence of coronary angioplasty implant and graft: Secondary | ICD-10-CM | POA: Insufficient documentation

## 2019-08-05 DIAGNOSIS — E785 Hyperlipidemia, unspecified: Secondary | ICD-10-CM | POA: Diagnosis not present

## 2019-08-05 DIAGNOSIS — Z79899 Other long term (current) drug therapy: Secondary | ICD-10-CM | POA: Insufficient documentation

## 2019-08-05 DIAGNOSIS — E118 Type 2 diabetes mellitus with unspecified complications: Secondary | ICD-10-CM

## 2019-08-05 HISTORY — PX: LEFT HEART CATH AND CORONARY ANGIOGRAPHY: CATH118249

## 2019-08-05 LAB — GLUCOSE, CAPILLARY: Glucose-Capillary: 207 mg/dL — ABNORMAL HIGH (ref 70–99)

## 2019-08-05 SURGERY — LEFT HEART CATH AND CORONARY ANGIOGRAPHY
Anesthesia: LOCAL

## 2019-08-05 MED ORDER — SODIUM CHLORIDE 0.9 % IV SOLN: 250.0000 mL | INTRAVENOUS | Status: DC | PRN

## 2019-08-05 MED ORDER — FENTANYL CITRATE (PF) 100 MCG/2ML IJ SOLN
INTRAMUSCULAR | Status: AC
  Filled 2019-08-05: qty 2

## 2019-08-05 MED ORDER — HEPARIN (PORCINE) IN NACL 1000-0.9 UT/500ML-% IV SOLN
INTRAVENOUS | Status: AC
  Filled 2019-08-05: qty 1000

## 2019-08-05 MED ORDER — MIDAZOLAM HCL 2 MG/2ML IJ SOLN
INTRAMUSCULAR | Status: DC | PRN
  Administered 2019-08-05: 1 mg via INTRAVENOUS

## 2019-08-05 MED ORDER — ASPIRIN 81 MG PO CHEW
81.0000 mg | CHEWABLE_TABLET | ORAL | Status: DC
  Filled 2019-08-05 (×2): qty 1

## 2019-08-05 MED ORDER — SODIUM CHLORIDE 0.9% FLUSH: 3.0000 mL | Freq: Two times a day (BID) | INTRAVENOUS | Status: DC

## 2019-08-05 MED ORDER — IOHEXOL 350 MG/ML SOLN
INTRAVENOUS | Status: DC | PRN
  Administered 2019-08-05: 75 mL

## 2019-08-05 MED ORDER — MIDAZOLAM HCL 2 MG/2ML IJ SOLN
INTRAMUSCULAR | Status: AC
  Filled 2019-08-05: qty 2

## 2019-08-05 MED ORDER — ONDANSETRON HCL 4 MG/2ML IJ SOLN: 4.0000 mg | Freq: Four times a day (QID) | INTRAMUSCULAR | Status: DC | PRN

## 2019-08-05 MED ORDER — VERAPAMIL HCL 2.5 MG/ML IV SOLN
INTRAVENOUS | Status: DC | PRN
  Administered 2019-08-05: 10 mL via INTRA_ARTERIAL

## 2019-08-05 MED ORDER — HEPARIN SODIUM (PORCINE) 1000 UNIT/ML IJ SOLN
INTRAMUSCULAR | Status: AC
  Filled 2019-08-05: qty 1

## 2019-08-05 MED ORDER — VERAPAMIL HCL 2.5 MG/ML IV SOLN
INTRAVENOUS | Status: AC
  Filled 2019-08-05: qty 2

## 2019-08-05 MED ORDER — SODIUM CHLORIDE 0.9 % WEIGHT BASED INFUSION: 1.0000 mL/kg/h | INTRAVENOUS | Status: AC

## 2019-08-05 MED ORDER — CLOPIDOGREL BISULFATE 75 MG PO TABS: 75.0000 mg | ORAL_TABLET | ORAL | Status: DC

## 2019-08-05 MED ORDER — LIDOCAINE HCL (PF) 1 % IJ SOLN
INTRAMUSCULAR | Status: AC
  Filled 2019-08-05: qty 30

## 2019-08-05 MED ORDER — HEPARIN (PORCINE) IN NACL 1000-0.9 UT/500ML-% IV SOLN
INTRAVENOUS | Status: DC | PRN
  Administered 2019-08-05 (×2): 500 mL

## 2019-08-05 MED ORDER — SODIUM CHLORIDE 0.9% FLUSH: 3.0000 mL | INTRAVENOUS | Status: DC | PRN

## 2019-08-05 MED ORDER — ASPIRIN 81 MG PO CHEW
81.0000 mg | CHEWABLE_TABLET | Freq: Once | ORAL | Status: AC
  Administered 2019-08-05: 81 mg via ORAL

## 2019-08-05 MED ORDER — METFORMIN HCL ER 500 MG PO TB24: 1000.0000 mg | ORAL_TABLET | Freq: Two times a day (BID) | ORAL | Status: AC

## 2019-08-05 MED ORDER — SODIUM CHLORIDE 0.9 % WEIGHT BASED INFUSION: 1.0000 mL/kg/h | INTRAVENOUS | Status: DC

## 2019-08-05 MED ORDER — LIDOCAINE HCL (PF) 1 % IJ SOLN
INTRAMUSCULAR | Status: DC | PRN
  Administered 2019-08-05: 2 mL

## 2019-08-05 MED ORDER — HEPARIN SODIUM (PORCINE) 1000 UNIT/ML IJ SOLN
INTRAMUSCULAR | Status: DC | PRN
  Administered 2019-08-05: 6000 [IU] via INTRAVENOUS

## 2019-08-05 MED ORDER — ACETAMINOPHEN 325 MG PO TABS: 650.0000 mg | ORAL_TABLET | ORAL | Status: DC | PRN

## 2019-08-05 MED ORDER — SODIUM CHLORIDE 0.9 % WEIGHT BASED INFUSION
3.0000 mL/kg/h | INTRAVENOUS | Status: AC
  Administered 2019-08-05: 3 mL/kg/h via INTRAVENOUS

## 2019-08-05 MED ORDER — FENTANYL CITRATE (PF) 100 MCG/2ML IJ SOLN
INTRAMUSCULAR | Status: DC | PRN
  Administered 2019-08-05: 25 ug via INTRAVENOUS

## 2019-08-05 SURGICAL SUPPLY — 9 items
CATH 5FR JL3.5 JR4 ANG PIG MP (CATHETERS) ×2 IMPLANT
DEVICE RAD COMP TR BAND LRG (VASCULAR PRODUCTS) ×2 IMPLANT
GLIDESHEATH SLEND SS 6F .021 (SHEATH) ×2 IMPLANT
GUIDEWIRE INQWIRE 1.5J.035X260 (WIRE) ×1 IMPLANT
INQWIRE 1.5J .035X260CM (WIRE) ×2
KIT HEART LEFT (KITS) ×2 IMPLANT
PACK CARDIAC CATHETERIZATION (CUSTOM PROCEDURE TRAY) ×2 IMPLANT
TRANSDUCER W/STOPCOCK (MISCELLANEOUS) ×2 IMPLANT
TUBING CIL FLEX 10 FLL-RA (TUBING) ×2 IMPLANT

## 2019-08-05 NOTE — Interval H&P Note (Signed)
History and Physical Interval Note:  08/05/2019 10:19 AM  Corey Silva  has presented today for surgery, with the diagnosis of abnormal nuclear stress test.  The various methods of treatment have been discussed with the patient and family. After consideration of risks, benefits and other options for treatment, the patient has consented to  Procedure(s): LEFT HEART CATH AND CORONARY ANGIOGRAPHY (N/A) as a surgical intervention.  The patient's history has been reviewed, patient examined, no change in status, stable for surgery.  I have reviewed the patient's chart and labs.  Questions were answered to the patient's satisfaction.   Cath Lab Visit (complete for each Cath Lab visit)  Clinical Evaluation Leading to the Procedure:   ACS: No.  Non-ACS:    Anginal Classification: CCS III  Anti-ischemic medical therapy: Maximal Therapy (2 or more classes of medications)  Non-Invasive Test Results: Intermediate-risk stress test findings: cardiac mortality 1-3%/year  Prior CABG: No previous CABG        Corey Silva [facility] 08/05/2019 10:19 AM

## 2019-08-05 NOTE — Discharge Instructions (Signed)
Radial Site Care  This sheet gives you information about how to care for yourself after your procedure. Your health care provider may also give you more specific instructions. If you have problems or questions, contact your health care provider. What can I expect after the procedure? After the procedure, it is common to have:  Bruising and tenderness at the catheter insertion area. Follow these instructions at home: Medicines  Take over-the-counter and prescription medicines only as told by your health care provider. Insertion site care  Follow instructions from your health care provider about how to take care of your insertion site. Make sure you: ? Wash your hands with soap and water before you change your bandage (dressing). If soap and water are not available, use hand sanitizer. ? Change your dressing as told by your health care provider. ? Leave stitches (sutures), skin glue, or adhesive strips in place. These skin closures may need to stay in place for 2 weeks or longer. If adhesive strip edges start to loosen and curl up, you may trim the loose edges. Do not remove adhesive strips completely unless your health care provider tells you to do that.  Check your insertion site every day for signs of infection. Check for: ? Redness, swelling, or pain. ? Fluid or blood. ? Pus or a bad smell. ? Warmth.  Do not take baths, swim, or use a hot tub until your health care provider approves.  You may shower 24-48 hours after the procedure, or as directed by your health care provider. ? Remove the dressing and gently wash the site with plain soap and water. ? Pat the area dry with a clean towel. ? Do not rub the site. That could cause bleeding.  Do not apply powder or lotion to the site. Activity   For 24 hours after the procedure, or as directed by your health care provider: ? Do not flex or bend the affected arm. ? Do not push or pull heavy objects with the affected arm. ? Do not  drive yourself home from the hospital or clinic. You may drive 24 hours after the procedure unless your health care provider tells you not to. ? Do not operate machinery or power tools.  Do not lift anything that is heavier than 10 lb (4.5 kg), or the limit that you are told, until your health care provider says that it is safe.  Ask your health care provider when it is okay to: ? Return to work or school. ? Resume usual physical activities or sports. ? Resume sexual activity. General instructions  If the catheter site starts to bleed, raise your arm and put firm pressure on the site. If the bleeding does not stop, get help right away. This is a medical emergency.  If you went home on the same day as your procedure, a responsible adult should be with you for the first 24 hours after you arrive home.  Keep all follow-up visits as told by your health care provider. This is important. Contact a health care provider if:  You have a fever.  You have redness, swelling, or yellow drainage around your insertion site. Get help right away if:  You have unusual pain at the radial site.  The catheter insertion area swells very fast.  The insertion area is bleeding, and the bleeding does not stop when you hold steady pressure on the area.  Your arm or hand becomes pale, cool, tingly, or numb. These symptoms may represent a serious problem   that is an emergency. Do not wait to see if the symptoms will go away. Get medical help right away. Call your local emergency services (911 in the [place] Do not drive yourself to the hospital. Summary  After the procedure, it is common to have bruising and tenderness at the site.  Follow instructions from your health care provider about how to take care of your radial site wound. Check the wound every day for signs of infection.  Do not lift anything that is heavier than 10 lb (4.5 kg), or the limit that you are told, until your health care provider says  that it is safe. This information is not intended to replace advice given to you by your health care provider. Make sure you discuss any questions you have with your health care provider. Document Revised: 05/30/2017 Document Reviewed: 05/30/2017 Elsevier Patient Education  2020 [employer]  

## 2019-08-13 ENCOUNTER — Other Ambulatory Visit (INDEPENDENT_AMBULATORY_CARE_PROVIDER_SITE_OTHER): Payer: No Typology Code available for payment source

## 2019-08-13 DIAGNOSIS — I10 Essential (primary) hypertension: Secondary | ICD-10-CM

## 2019-08-14 DIAGNOSIS — E119 Type 2 diabetes mellitus without complications: Secondary | ICD-10-CM | POA: Diagnosis not present

## 2019-08-14 DIAGNOSIS — K7460 Unspecified cirrhosis of liver: Secondary | ICD-10-CM | POA: Diagnosis not present

## 2019-08-14 DIAGNOSIS — E669 Obesity, unspecified: Secondary | ICD-10-CM | POA: Diagnosis not present

## 2019-08-14 DIAGNOSIS — E785 Hyperlipidemia, unspecified: Secondary | ICD-10-CM | POA: Diagnosis not present

## 2019-08-18 NOTE — Progress Notes (Signed)
Cardiology Office Note:    Date:  08/19/2019   ID:  Corey Silva, DOB 1961-04-22, MRN [id number]  PCP:  Erick Alley, MD  Cardiologist:  Little Ishikawa, MD  Electrophysiologist:  None   Referring MD: Erick Alley, MD   Chief Complaint  Patient presents with  . Follow-up    1 month.  . Chest Pain  . Headache  . Shortness of Breath    History of Present Illness:    Corey Silva is a 59 y.o. male with a hx of CAD, cirrhosis, type 2 diabetes, hypertension,hyperlipidemia who presents for follow-up.  He was referred by Dr. Nelva Nay for evaluation of chest pain, initially seen on 07/14/2019.  He has had 3 stents placed, most recently at the [facility] in [place], [place] in 2015.  He states he has always had some chest pain but recently has been worsening.  States that he is having chest pain 1-2 times per week.  Can occur at rest or with exertion.  States that it is left-sided pain, describes as a burning sensation but sometimes with pressure.  Reports it is more intense than he has been having in the past.  Last 5 to 20 minutes and resolved.  He has nitroglycerin but does not take because he has had severe headaches with it.  States that he has not taken nitroglycerin for 16 years.  He is on aspirin and Plavix.  He is also inquiring on a procedure, as was being evaluated for penile pump and would need to hold the Plavix for 14 days.  Cath 11/2013 at [facility] in [place] showed 60% in-stent restenosis in proximal LAD, 20% in-stent restenosis in D1, 90% stenosis in distal RCA.  S/p DES to distal RCA.  Lexiscan Myoview on 07/23/2019 showed basal to mid anterior ischemia.  Cath on 08/05/2019 showed proximal RCA 30%, mid RCA 30%, RPL 40%, patent distal RCA stent, patent RPAV stent, RPAV 60%, mid LAD 50%, 100% D1 fills by left to left collaterals, 95% ostial D2 not amenable to PCI, normal LV systolic function.  Since last clinic visit, reports he has been having chest pain 2-3 times per week.  Can occur at rest.   Most exertion he does is walking around his home, he has not been exercising.  Chest pain typically last 5 to 10 minutes and resolves.  He has not taken any nitroglycerin, has had significant headache when he last took nitroglycerin (which was in the 1990s).   Past Medical History:  Diagnosis Date  . Cirrhosis (HCC)   . Coronary artery disease   . Diabetes mellitus without complication (HCC)   . Hypertension       Current Medications: Current Meds  Medication Sig  . Apoaequorin (PREVAGEN EXTRA STRENGTH) 20 MG CAPS Take 1 tablet by mouth daily.  Marland Kitchen aspirin EC 81 MG tablet Take 81 mg by mouth daily.  . busPIRone (BUSPAR) 10 MG tablet Take 20 mg by mouth 2 (two) times daily.   . carboxymethylcellulose (REFRESH PLUS) 0.5 % SOLN Place 1 drop into both eyes 3 (three) times daily as needed (Dry eyes).  . clopidogrel (PLAVIX) 75 MG tablet Take 75 mg by mouth daily.   . DORZOLAMIDE HCL-TIMOLOL MAL OP Place 1 drop into the left eye daily.  . DULoxetine (CYMBALTA) 60 MG capsule Take 60 mg by mouth 2 (two) times daily.   Marland Kitchen gabapentin (NEURONTIN) 300 MG capsule Take 900 mg by mouth 4 (four) times daily.   . hydrochlorothiazide (HYDRODIURIL) 25  MG tablet Take 25 mg by mouth daily.  . insulin regular human CONCENTRATED (HUMULIN R) 500 UNIT/ML injection Inject 30 Units into the skin 3 (three) times daily with meals.  Marland Kitchen losartan (COZAAR) 100 MG tablet Take 100 mg by mouth daily.  . metFORMIN (GLUCOPHAGE-XR) 500 MG 24 hr tablet Take 2 tablets (1,000 mg total) by mouth in the morning and at bedtime.  . metoprolol tartrate (LOPRESSOR) 50 MG tablet Take 1 tablet (50 mg total) by mouth 2 (two) times daily.  . Multiple Vitamins-Minerals (MULTIVITAMIN WITH MINERALS) tablet Take 1 tablet by mouth daily.  . Multiple Vitamins-Minerals (PRESERVISION AREDS PO) Take 1 tablet by mouth in the morning and at bedtime.  . nitroGLYCERIN (NITROSTAT) 0.4 MG SL tablet Place 1 tablet (0.4 mg total) under the tongue every 5  (five) minutes as needed for chest pain.  Marland Kitchen omeprazole (PRILOSEC) 20 MG capsule Take 20 mg by mouth daily.  Marland Kitchen OVER THE COUNTER MEDICATION Take 3 tablets by mouth daily. testboost  . pregabalin (LYRICA) 225 MG capsule Take 225 mg by mouth 2 (two) times daily.   . rosuvastatin (CRESTOR) 40 MG tablet Take 20 mg by mouth daily.  Marland Kitchen SEMAGLUTIDE, 1 MG/DOSE, [place] Inject 0.5 mg into the skin every Sunday.   . tamsulosin (FLOMAX) 0.4 MG CAPS capsule Take 0.8 mg by mouth at bedtime.  . vitamin B-12 (CYANOCOBALAMIN) 1000 MCG tablet Take 1,000 mcg by mouth daily.  . Vitamin D, Cholecalciferol, 25 MCG (1000 UT) TABS Take 1,000 mg by mouth daily.      Allergies:   Sm non-asprin sinus [pseudoephedrine-acetaminophen]   Social History   Socioeconomic History  . Marital status: Married    Spouse name: Not on file  . Number of children: Not on file  . Years of education: Not on file  . Highest education level: Not on file  Occupational History  . Not on file  Tobacco Use  . Smoking status: Never Smoker  . Smokeless tobacco: Never Used  Substance and Sexual Activity  . Alcohol use: Not Currently  . Drug use: Never  . Sexual activity: Yes  Other Topics Concern  . Not on file  Social History Narrative  . Not on file   Social Determinants of Health   Financial Resource Strain:   . Difficulty of Paying Living Expenses:   Food Insecurity:   . Worried About Programme researcher, broadcasting/film/video in the Last Year:   . Barista in the Last Year:   Transportation Needs:   . Freight forwarder (Medical):   Marland Kitchen Lack of Transportation (Non-Medical):   Physical Activity:   . Days of Exercise per Week:   . Minutes of Exercise per Session:   Stress:   . Feeling of Stress :   Social Connections:   . Frequency of Communication with Friends and Family:   . Frequency of Social Gatherings with Friends and Family:   . Attends Religious Services:   . Active Member of Clubs or Organizations:   . Attends Tax inspector Meetings:   Marland Kitchen Marital Status:      Family History: No relevant family history ROS:   Please see the history of present illness.     All other systems reviewed and are negative.  EKGs/Labs/Other Studies Reviewed:    The following studies were reviewed today:  EKG:  EKG is ordered today.  The ekg ordered today demonstrates normal sinus rhythm,  rate 73, no ST abnormalities  Lexiscan Myoview  07/23/2019:  The left ventricular ejection fraction is normal (55-65%).  Nuclear stress EF: 55%.  There was no ST segment deviation noted during stress.  No T wave inversion was noted during stress.  Findings consistent with ischemia.  This is an intermediate risk study.   Impression:  1. Diaphragm attenuation artifact.  2. There is a medium size (6-8%), moderate perfusion defect present in the basal to mid anterior wall consistent with ischemia.  3. No evidence of prior infarction.  4. Normal LVEF, 55%.  5. This is an intermediate risk study given the patient's history of CAD/PCI.   Cath 08/04/29:  Previously placed Dist RCA stent (unknown type) is widely patent.  Prox RCA lesion is 30% stenosed.  Mid RCA-1 lesion is 30% stenosed.  Mid RCA-2 lesion is 35% stenosed.  1st RPL lesion is 40% stenosed.  Previously placed RPAV-1 stent (unknown type) is widely patent.  RPAV-2 lesion is 60% stenosed.  Mid LAD lesion is 50% stenosed.  1st Diag lesion is 100% stenosed.  2nd Diag lesion is 95% stenosed.  The left ventricular systolic function is normal.  LV end diastolic pressure is normal.  The left ventricular ejection fraction is 55-65% by visual estimate.   1. Single vessel obstructive CAD    - Stents in proximal to mid LAD are patent with focal area of 50% narrowing.    - 100% first diagonal. Fills by left to left collaterals.    - 95% ostial small second diagonal    - stent in distal RCA is patent. 2. Normal LV function 3. Normal LVEDP  Plan:  compared to last cath report from [place] in 2017 there is no significant change. Recommend continued medical therapy. Area of ischemia noted on stress testing corresponds to diagonal disease that is not suitable for PCI.     Recent Labs: 08/01/2019: BUN 26; Creatinine, Ser 0.85; Hemoglobin 15.3; Platelets 150; Potassium 4.5; Sodium 140  Recent Lipid Panel No results found for: CHOL, TRIG, HDL, CHOLHDL, VLDL, LDLCALC, LDLDIRECT  Physical Exam:    VS:  BP 136/78 (BP Location: Right Arm, Patient Position: Sitting, Cuff Size: Normal)   Pulse 73   Temp (!) 97.2 F (36.2 C)   Ht 6\' 1"  (1.854 m)   Wt 260 lb (117.9 kg)   BMI 34.30 kg/m     Wt Readings from Last 3 Encounters:  08/19/19 260 lb (117.9 kg)  08/05/19 260 lb (117.9 kg)  08/01/19 256 lb (116.1 kg)     GEN:  Well nourished, well developed in no acute distress HEENT: Normal NECK: No JVD LYMPHATICS: No lymphadenopathy CARDIAC: RRR, 2/6 systolic murmur RESPIRATORY:  Clear to auscultation without rales, wheezing or rhonchi  ABDOMEN: Soft, non-tender, non-distended MUSCULOSKELETAL:  No edema; No deformity  SKIN: Warm and dry NEUROLOGIC:  Alert and oriented x 3 PSYCHIATRIC:  Normal affect   ASSESSMENT:    1. Coronary artery disease involving native coronary artery of native heart with angina pectoris ([place])   2. Claudication ([place])   3. Essential hypertension   4. Hyperlipidemia, unspecified hyperlipidemia type    PLAN:     CAD: Status post mid LAD and distal RCA stenting.  Currently with atypical angina.  Lexiscan Myoview showed anterior ischemia.  Cath on 08/05/2019 showed patent stents, occluded D1, 95% D2 not amenable to intervention.  Medical management recommended. -Continue ASA 81 mg,Plavix 75 mg daily, rosuvastatin 20 mg daily -Continue metoprolol 50 mg twice daily -Start imdur 30 mg daily -SL NTG prn  Hypertension:  appears controlled, continue current meds  Type 2 diabetes: On insulin.  A1c 6.9 on  06/04/2019  Hyperlipidemia: On rosuvastatin 20 mg daily.  LDL 44 on 05/29/2019  Claudication: will check ABIs  RTC in 1 month  Medication Adjustments/Labs and Tests Ordered: Current medicines are reviewed at length with the patient today.  Concerns regarding medicines are outlined above.  Orders Placed This Encounter  Procedures  . EKG 12-Lead  . VAS [place] ABI WITH/WO TBI  . VAS [place] LOWER EXTREMITY ARTERIAL DUPLEX   Meds ordered this encounter  Medications  . isosorbide mononitrate (IMDUR) 30 MG 24 hr tablet    Sig: Take 1 tablet (30 mg total) by mouth daily.    Dispense:  90 tablet    Refill:  3    Patient Instructions  Medication Instructions:  START isosorbide (Imdur) 30 mg daily  *If you need a refill on your cardiac medications before your next appointment, please call your pharmacy*   Lab Work: NONE  Testing/Procedures: Your physician has requested that you have an ankle brachial index (ABI). During this test an ultrasound and blood pressure cuff are used to evaluate the arteries that supply the arms and legs with blood. Allow thirty minutes for this exam. There are no restrictions or special instructions.  Follow-Up: At [facility], you and your health needs are our priority.  As part of our continuing mission to provide you with exceptional heart care, we have created designated Provider Care Teams.  These Care Teams include your primary Cardiologist (physician) and Advanced Practice Providers (APPs -  Physician Assistants and Nurse Practitioners) who all work together to provide you with the care you need, when you need it.  We recommend signing up for the patient portal called "MyChart".  Sign up information is provided on this After Visit Summary.  MyChart is used to connect with patients for Virtual Visits (Telemedicine).  Patients are able to view lab/test results, encounter notes, upcoming appointments, etc.  Non-urgent messages can be sent to your provider as well.    To learn more about what you can do with MyChart, go to [email or web address].    Your next appointment:   1 month(s)  The format for your next appointment:   In Person  Provider:   Epifanio Lesches, MD        Signed, Little Ishikawa, MD  08/19/2019 10:50 PM    [facility]

## 2019-08-19 ENCOUNTER — Encounter: Payer: Self-pay | Admitting: Cardiology

## 2019-08-19 ENCOUNTER — Ambulatory Visit (INDEPENDENT_AMBULATORY_CARE_PROVIDER_SITE_OTHER): Payer: No Typology Code available for payment source | Admitting: Cardiology

## 2019-08-19 ENCOUNTER — Other Ambulatory Visit: Payer: Self-pay

## 2019-08-19 VITALS — BP 136/78 | HR 73 | Temp 97.2°F | Ht 73.0 in | Wt 260.0 lb

## 2019-08-19 DIAGNOSIS — I25119 Atherosclerotic heart disease of native coronary artery with unspecified angina pectoris: Secondary | ICD-10-CM

## 2019-08-19 DIAGNOSIS — I739 Peripheral vascular disease, unspecified: Secondary | ICD-10-CM

## 2019-08-19 DIAGNOSIS — I10 Essential (primary) hypertension: Secondary | ICD-10-CM

## 2019-08-19 DIAGNOSIS — E785 Hyperlipidemia, unspecified: Secondary | ICD-10-CM

## 2019-08-19 MED ORDER — ISOSORBIDE MONONITRATE ER 30 MG PO TB24: 30.0000 mg | ORAL_TABLET | Freq: Every day | ORAL | 3 refills | Status: DC

## 2019-08-19 NOTE — Patient Instructions (Signed)
Medication Instructions:  START isosorbide (Imdur) 30 mg daily  *If you need a refill on your cardiac medications before your next appointment, please call your pharmacy*   Lab Work: NONE  Testing/Procedures: Your physician has requested that you have an ankle brachial index (ABI). During this test an ultrasound and blood pressure cuff are used to evaluate the arteries that supply the arms and legs with blood. Allow thirty minutes for this exam. There are no restrictions or special instructions.  Follow-Up: At [facility], you and your health needs are our priority.  As part of our continuing mission to provide you with exceptional heart care, we have created designated Provider Care Teams.  These Care Teams include your primary Cardiologist (physician) and Advanced Practice Providers (APPs -  Physician Assistants and Nurse Practitioners) who all work together to provide you with the care you need, when you need it.  We recommend signing up for the patient portal called "MyChart".  Sign up information is provided on this After Visit Summary.  MyChart is used to connect with patients for Virtual Visits (Telemedicine).  Patients are able to view lab/test results, encounter notes, upcoming appointments, etc.  Non-urgent messages can be sent to your provider as well.   To learn more about what you can do with MyChart, go to [email or web address].    Your next appointment:   1 month(s)  The format for your next appointment:   In Person  Provider:   Epifanio Lesches, MD

## 2019-08-22 ENCOUNTER — Ambulatory Visit: Payer: No Typology Code available for payment source | Admitting: Cardiology

## 2019-09-01 ENCOUNTER — Other Ambulatory Visit: Payer: Self-pay

## 2019-09-01 ENCOUNTER — Ambulatory Visit (HOSPITAL_COMMUNITY)
Admission: RE | Admit: 2019-09-01 | Discharge: 2019-09-01 | Disposition: A | Discharge status: Home / Self Care | Payer: No Typology Code available for payment source | Source: Ambulatory Visit | Attending: Cardiology | Admitting: Cardiology

## 2019-09-01 DIAGNOSIS — I739 Peripheral vascular disease, unspecified: Secondary | ICD-10-CM

## 2019-09-14 NOTE — Progress Notes (Signed)
Cardiology Office Note:    Date:  09/17/2019   ID:  Margot Ables, DOB [birth year], MRN [id number]  PCP:  Verda Cumins, MD  Cardiologist:  Donato Heinz, MD  Electrophysiologist:  None   Referring MD: Verda Cumins, MD   Chief Complaint  Patient presents with  . Chest Pain    History of Present Illness:    Corey Silva is a 59 y.o. male with a hx of CAD, cirrhosis, type 2 diabetes, hypertension,hyperlipidemia who presents for follow-up.  He was referred by Dr. Elberta Spaniel for evaluation of chest pain, initially seen on 07/14/2019.  Cath 11/2013 at [facility] in [place] showed 60% in-stent restenosis in proximal LAD, 20% in-stent restenosis in D1, 90% stenosis in distal RCA.  S/p DES to distal RCA.  Lexiscan Myoview on 07/23/2019 showed basal to mid anterior ischemia.  Cath on 08/05/2019 showed proximal RCA 30%, mid RCA 30%, RPL 40%, patent distal RCA stent, patent RPAV stent, RPAV 60%, mid LAD 50%, 100% D1 fills by left to left collaterals, 95% ostial D2 not amenable to PCI, normal LV systolic function.  ABIs 09/01/2019 showed noncompressible vessels with normal TBI's.  At last clinic visit, Imdur was added to antianginal regimen.  Since that time, he reports his chest pain is unchanged.  Continues to have pain 1-2 times per week.  Lasts for 5 to 10 minutes and resolves.  Described as burning on left side of his chest.  Can occur at rest or with exertion.  Reports that most exertion he does is walking around his house or at the grocery store.  Occasional lightheadedness.  He has had some minor headaches since starting Imdur, but denies anything significant.  He has continued to take aspirin and Plavix.  Reports intermittent epistaxis, otherwise denies any bleeding issues.   Past Medical History:  Diagnosis Date  . Cirrhosis ([place])   . Coronary artery disease   . Diabetes mellitus without complication ([place])   . Hypertension       Current Medications: Current Meds  Medication Sig  . Apoaequorin  (PREVAGEN EXTRA STRENGTH) 20 MG CAPS Take 1 tablet by mouth daily.  Marland Kitchen aspirin EC 81 MG tablet Take 81 mg by mouth daily.  . busPIRone (BUSPAR) 10 MG tablet Take 20 mg by mouth 2 (two) times daily.   . carboxymethylcellulose (REFRESH PLUS) 0.5 % SOLN Place 1 drop into both eyes 3 (three) times daily as needed (Dry eyes).  . clopidogrel (PLAVIX) 75 MG tablet Take 75 mg by mouth daily.   . DORZOLAMIDE HCL-TIMOLOL MAL OP Place 1 drop into the left eye daily.  . DULoxetine (CYMBALTA) 60 MG capsule Take 60 mg by mouth 2 (two) times daily.   Marland Kitchen gabapentin (NEURONTIN) 300 MG capsule Take 900 mg by mouth 4 (four) times daily.   . hydrochlorothiazide (HYDRODIURIL) 25 MG tablet Take 25 mg by mouth daily.  . insulin regular human CONCENTRATED (HUMULIN R) 500 UNIT/ML injection Inject 30 Units into the skin 3 (three) times daily with meals.  . isosorbide mononitrate (IMDUR) 60 MG 24 hr tablet Take 1 tablet (60 mg total) by mouth daily.  Marland Kitchen losartan (COZAAR) 100 MG tablet Take 100 mg by mouth daily.  . metFORMIN (GLUCOPHAGE-XR) 500 MG 24 hr tablet Take 2 tablets (1,000 mg total) by mouth in the morning and at bedtime.  . metoprolol tartrate (LOPRESSOR) 50 MG tablet Take 1 tablet (50 mg total) by mouth 2 (two) times daily.  . Multiple Vitamins-Minerals (MULTIVITAMIN WITH MINERALS) tablet  Take 1 tablet by mouth daily.  . Multiple Vitamins-Minerals (PRESERVISION AREDS PO) Take 1 tablet by mouth in the morning and at bedtime.  . nitroGLYCERIN (NITROSTAT) 0.4 MG SL tablet Place 1 tablet (0.4 mg total) under the tongue every 5 (five) minutes as needed for chest pain.  Marland Kitchen omeprazole (PRILOSEC) 20 MG capsule Take 20 mg by mouth daily.  Marland Kitchen OVER THE COUNTER MEDICATION Take 3 tablets by mouth daily. testboost  . pregabalin (LYRICA) 225 MG capsule Take 225 mg by mouth 2 (two) times daily.   . rosuvastatin (CRESTOR) 40 MG tablet Take 20 mg by mouth daily.  Marland Kitchen SEMAGLUTIDE, 1 MG/DOSE, [place] Inject 0.5 mg into the skin every Sunday.    . tamsulosin (FLOMAX) 0.4 MG CAPS capsule Take 0.8 mg by mouth at bedtime.  . vitamin B-12 (CYANOCOBALAMIN) 1000 MCG tablet Take 1,000 mcg by mouth daily.  . Vitamin D, Cholecalciferol, 25 MCG (1000 UT) TABS Take 1,000 mg by mouth daily.   . [DISCONTINUED] isosorbide mononitrate (IMDUR) 30 MG 24 hr tablet Take 1 tablet (30 mg total) by mouth daily.     Allergies:   Sm non-asprin sinus [pseudoephedrine-acetaminophen]   Social History   Socioeconomic History  . Marital status: Married    Spouse name: Not on file  . Number of children: Not on file  . Years of education: Not on file  . Highest education level: Not on file  Occupational History  . Not on file  Tobacco Use  . Smoking status: Never Smoker  . Smokeless tobacco: Never Used  Substance and Sexual Activity  . Alcohol use: Not Currently  . Drug use: Never  . Sexual activity: Yes  Other Topics Concern  . Not on file  Social History Narrative  . Not on file   Social Determinants of Health   Financial Resource Strain:   . Difficulty of Paying Living Expenses:   Food Insecurity:   . Worried About Programme researcher, broadcasting/film/video in the Last Year:   . Barista in the Last Year:   Transportation Needs:   . Freight forwarder (Medical):   Marland Kitchen Lack of Transportation (Non-Medical):   Physical Activity:   . Days of Exercise per Week:   . Minutes of Exercise per Session:   Stress:   . Feeling of Stress :   Social Connections:   . Frequency of Communication with Friends and Family:   . Frequency of Social Gatherings with Friends and Family:   . Attends Religious Services:   . Active Member of Clubs or Organizations:   . Attends Banker Meetings:   Marland Kitchen Marital Status:      Family History: No relevant family history ROS:   Please see the history of present illness.     All other systems reviewed and are negative.  EKGs/Labs/Other Studies Reviewed:    The following studies were reviewed today:  EKG:  EKG  is ordered today.  The ekg ordered today demonstrates normal sinus rhythm,  rate 76, no ST abnormalities  Lexiscan Myoview 07/23/2019:  The left ventricular ejection fraction is normal (55-65%).  Nuclear stress EF: 55%.  There was no ST segment deviation noted during stress.  No T wave inversion was noted during stress.  Findings consistent with ischemia.  This is an intermediate risk study.   Impression:  1. Diaphragm attenuation artifact.  2. There is a medium size (6-8%), moderate perfusion defect present in the basal to mid anterior wall consistent with  ischemia.  3. No evidence of prior infarction.  4. Normal LVEF, 55%.  5. This is an intermediate risk study given the patient's history of CAD/PCI.   Cath 08/04/29:  Previously placed Dist RCA stent (unknown type) is widely patent.  Prox RCA lesion is 30% stenosed.  Mid RCA-1 lesion is 30% stenosed.  Mid RCA-2 lesion is 35% stenosed.  1st RPL lesion is 40% stenosed.  Previously placed RPAV-1 stent (unknown type) is widely patent.  RPAV-2 lesion is 60% stenosed.  Mid LAD lesion is 50% stenosed.  1st Diag lesion is 100% stenosed.  2nd Diag lesion is 95% stenosed.  The left ventricular systolic function is normal.  LV end diastolic pressure is normal.  The left ventricular ejection fraction is 55-65% by visual estimate.   1. Single vessel obstructive CAD    - Stents in proximal to mid LAD are patent with focal area of 50% narrowing.    - 100% first diagonal. Fills by left to left collaterals.    - 95% ostial small second diagonal    - stent in distal RCA is patent. 2. Normal LV function 3. Normal LVEDP  Plan: compared to last cath report from [place] in 2017 there is no significant change. Recommend continued medical therapy. Area of ischemia noted on stress testing corresponds to diagonal disease that is not suitable for PCI.     ABIs 09/01/19: Right: Resting right ankle-brachial index indicates  noncompressible right  lower extremity arteries. The right toe-brachial index is normal.   Left: Resting left ankle-brachial index indicates noncompressible left  lower extremity arteries. The left toe-brachial index is normal.   Recent Labs: 08/01/2019: BUN 26; Creatinine, Ser 0.85; Hemoglobin 15.3; Platelets 150; Potassium 4.5; Sodium 140  Recent Lipid Panel No results found for: CHOL, TRIG, HDL, CHOLHDL, VLDL, LDLCALC, LDLDIRECT  Physical Exam:    VS:  BP 110/70   Pulse 76   Ht 6\' 1"  (1.854 m)   Wt 261 lb 6.4 oz (118.6 kg)   SpO2 93%   BMI 34.49 kg/m     Wt Readings from Last 3 Encounters:  09/17/19 261 lb 6.4 oz (118.6 kg)  08/19/19 260 lb (117.9 kg)  08/05/19 260 lb (117.9 kg)     GEN:  Well nourished, well developed in no acute distress HEENT: Normal NECK: No JVD LYMPHATICS: No lymphadenopathy CARDIAC: RRR, 2/6 systolic murmur RESPIRATORY:  Clear to auscultation without rales, wheezing or rhonchi  ABDOMEN: Soft, non-tender, non-distended MUSCULOSKELETAL:  No edema; No deformity  SKIN: Warm and dry NEUROLOGIC:  Alert and oriented x 3 PSYCHIATRIC:  Normal affect   ASSESSMENT:    1. Coronary artery disease involving native coronary artery of native heart with angina pectoris (HCC)   2. Essential hypertension   3. Hyperlipidemia, unspecified hyperlipidemia type    PLAN:     CAD: Status post mid LAD and distal RCA stenting.  Currently with atypical angina.  Lexiscan Myoview showed anterior ischemia.  Cath on 08/05/2019 showed patent stents, occluded D1, 95% D2 not amenable to intervention.  Medical management recommended.  Continues to have intermittent chest pain. -Continue ASA 81 mg,Plavix 75 mg daily, rosuvastatin 20 mg daily -Continue metoprolol 50 mg twice daily -Increase Imdur to 60 mg daily -SL NTG prn  Hypertension: appears controlled, continue current meds  Type 2 diabetes: On insulin.  A1c 6.9 on 06/04/2019  Hyperlipidemia: On rosuvastatin 20 mg  daily.  LDL 44 on 05/29/2019  Leg pain: ABIs show noncompressible vessels but normal TBI's.  RTC in 3 months  Medication Adjustments/Labs and Tests Ordered: Current medicines are reviewed at length with the patient today.  Concerns regarding medicines are outlined above.  Orders Placed This Encounter  Procedures  . EKG 12-Lead   Meds ordered this encounter  Medications  . isosorbide mononitrate (IMDUR) 60 MG 24 hr tablet    Sig: Take 1 tablet (60 mg total) by mouth daily.    Dispense:  90 tablet    Refill:  3    Dose increase    Patient Instructions  Medication Instructions:  INCREASE isosorbide (Imdur) to 60 mg daily  *If you need a refill on your cardiac medications before your next appointment, please call your pharmacy*  Follow-Up: At [facility], you and your health needs are our priority.  As part of our continuing mission to provide you with exceptional heart care, we have created designated Provider Care Teams.  These Care Teams include your primary Cardiologist (physician) and Advanced Practice Providers (APPs -  Physician Assistants and Nurse Practitioners) who all work together to provide you with the care you need, when you need it.  We recommend signing up for the patient portal called "MyChart".  Sign up information is provided on this After Visit Summary.  MyChart is used to connect with patients for Virtual Visits (Telemedicine).  Patients are able to view lab/test results, encounter notes, upcoming appointments, etc.  Non-urgent messages can be sent to your provider as well.   To learn more about what you can do with MyChart, go to [email or web address].    Your next appointment:   3 month(s)  The format for your next appointment:   In Person  Provider:   Epifanio Lesches, MD        Signed, Little Ishikawa, MD  09/17/2019 1:35 PM    [facility]

## 2019-09-17 ENCOUNTER — Other Ambulatory Visit: Payer: Self-pay

## 2019-09-17 ENCOUNTER — Encounter: Payer: Self-pay | Admitting: Cardiology

## 2019-09-17 ENCOUNTER — Ambulatory Visit (INDEPENDENT_AMBULATORY_CARE_PROVIDER_SITE_OTHER): Payer: No Typology Code available for payment source | Admitting: Cardiology

## 2019-09-17 VITALS — BP 110/70 | HR 76 | Ht 73.0 in | Wt 261.4 lb

## 2019-09-17 DIAGNOSIS — I25119 Atherosclerotic heart disease of native coronary artery with unspecified angina pectoris: Secondary | ICD-10-CM

## 2019-09-17 DIAGNOSIS — I10 Essential (primary) hypertension: Secondary | ICD-10-CM | POA: Diagnosis not present

## 2019-09-17 DIAGNOSIS — E785 Hyperlipidemia, unspecified: Secondary | ICD-10-CM | POA: Diagnosis not present

## 2019-09-17 MED ORDER — ISOSORBIDE MONONITRATE ER 60 MG PO TB24: 60.0000 mg | ORAL_TABLET | Freq: Every day | ORAL | 3 refills | Status: DC

## 2019-09-17 NOTE — Patient Instructions (Signed)
Medication Instructions:  INCREASE isosorbide (Imdur) to 60 mg daily  *If you need a refill on your cardiac medications before your next appointment, please call your pharmacy*  Follow-Up: At [facility], you and your health needs are our priority.  As part of our continuing mission to provide you with exceptional heart care, we have created designated Provider Care Teams.  These Care Teams include your primary Cardiologist (physician) and Advanced Practice Providers (APPs -  Physician Assistants and Nurse Practitioners) who all work together to provide you with the care you need, when you need it.  We recommend signing up for the patient portal called "MyChart".  Sign up information is provided on this After Visit Summary.  MyChart is used to connect with patients for Virtual Visits (Telemedicine).  Patients are able to view lab/test results, encounter notes, upcoming appointments, etc.  Non-urgent messages can be sent to your provider as well.   To learn more about what you can do with MyChart, go to [email or web address].    Your next appointment:   3 month(s)  The format for your next appointment:   In Person  Provider:   Epifanio Lesches, MD

## 2019-09-18 DIAGNOSIS — E113512 Type 2 diabetes mellitus with proliferative diabetic retinopathy with macular edema, left eye: Secondary | ICD-10-CM | POA: Diagnosis not present

## 2019-09-18 DIAGNOSIS — E113411 Type 2 diabetes mellitus with severe nonproliferative diabetic retinopathy with macular edema, right eye: Secondary | ICD-10-CM | POA: Diagnosis not present

## 2019-09-18 DIAGNOSIS — H35373 Puckering of macula, bilateral: Secondary | ICD-10-CM | POA: Diagnosis not present

## 2019-09-18 DIAGNOSIS — H4912 Fourth [trochlear] nerve palsy, left eye: Secondary | ICD-10-CM | POA: Diagnosis not present

## 2019-10-16 ENCOUNTER — Telehealth: Payer: Self-pay | Admitting: Emergency Medicine

## 2019-10-16 NOTE — Telephone Encounter (Signed)
   Primary Cardiologist: Little Ishikawa, MD  Chart reviewed as part of pre-operative protocol coverage. Given past medical history and time since last visit, based on ACC/AHA guidelines, Corey Silva would be at acceptable risk for the planned procedure without further cardiovascular testing.   He may hold his isosorbide for 1 day prior to the procedure.  Resume as soon as possible after the procedure.  I will route this recommendation to the requesting party via Epic fax function and remove from pre-op pool.  Please call with questions.  Corey Silva. Corey Busbin NP-C    10/16/2019, 12:26 PM [facility] [address] Office [phone] Fax [phone]

## 2019-10-16 NOTE — Telephone Encounter (Signed)
   [facility] Pre-operative Risk Assessment    Request for surgical clearance:  1. What type of surgery is being performed? Inflatable Penile Prothesis placed (IPP)  2. When is this surgery scheduled? 11/19/19  3. What type of clearance is required (medical clearance vs. Pharmacy clearance to hold med vs. Both)? Both  4. Are there any medications that need to be held prior to surgery and how long? Isosorbid 60 mg x1 daily  5. Practice name and name of physician performing surgery? Dr. Holly Bodily at [facility]   6. What is your office phone number? [phone]   7.   What is your office fax number? [phone]  8.   Anesthesia type (None, local, MAC, general) ? General   Antonisha Waskey Lestine Mount 10/16/2019, 10:23 AM  _________________________________________________________________   (provider comments below)

## 2019-10-28 DIAGNOSIS — Z01812 Encounter for preprocedural laboratory examination: Secondary | ICD-10-CM | POA: Diagnosis not present

## 2019-10-28 DIAGNOSIS — N529 Male erectile dysfunction, unspecified: Secondary | ICD-10-CM | POA: Diagnosis not present

## 2019-10-28 DIAGNOSIS — Z87891 Personal history of nicotine dependence: Secondary | ICD-10-CM | POA: Diagnosis not present

## 2019-10-28 DIAGNOSIS — Z01818 Encounter for other preprocedural examination: Secondary | ICD-10-CM | POA: Diagnosis not present

## 2019-11-04 ENCOUNTER — Telehealth: Payer: Self-pay | Admitting: Cardiology

## 2019-11-04 NOTE — Telephone Encounter (Signed)
° ° ° °  Julieanne Cotton from [place] calling and would like to get copy of pt's echo report/results she gave fax#[phone]

## 2019-11-04 NOTE — Telephone Encounter (Signed)
Spoke to [place] from [facility] [place] who is requesting a copy of pt's ECHO. Results e-faxed to their office.

## 2019-11-14 NOTE — Telephone Encounter (Signed)
I spoke with Jill [facility] of [place] system and Dr. Bjorn Pippin, patient's recent cath on 08/05/2019 showed stable anatomy, he may hold Plavix for 5 days prior to the procedure and restart as soon as possible after the procedure at the surgeon's discretion.

## 2019-11-14 NOTE — Telephone Encounter (Signed)
Colleen from the [facility] [place] was calling to ask about holding this patient's Plavix prior to his procedure 11/19/19. Jill Side is aware that the patient's PCP writes the rx for the medication but she has not gotten a response from the patient's PCP. She was hoping Dr. Bjorn Pippin could give his recommendation on this

## 2019-11-17 DIAGNOSIS — Z20822 Contact with and (suspected) exposure to covid-19: Secondary | ICD-10-CM | POA: Diagnosis not present

## 2019-11-19 DIAGNOSIS — N529 Male erectile dysfunction, unspecified: Secondary | ICD-10-CM | POA: Diagnosis not present

## 2019-11-19 DIAGNOSIS — N521 Erectile dysfunction due to diseases classified elsewhere: Secondary | ICD-10-CM | POA: Diagnosis not present

## 2019-11-19 DIAGNOSIS — Z794 Long term (current) use of insulin: Secondary | ICD-10-CM | POA: Diagnosis not present

## 2019-11-19 DIAGNOSIS — E119 Type 2 diabetes mellitus without complications: Secondary | ICD-10-CM | POA: Diagnosis not present

## 2019-11-20 DIAGNOSIS — Z48816 Encounter for surgical aftercare following surgery on the genitourinary system: Secondary | ICD-10-CM | POA: Diagnosis not present

## 2019-11-26 DIAGNOSIS — K7460 Unspecified cirrhosis of liver: Secondary | ICD-10-CM | POA: Diagnosis not present

## 2019-11-26 DIAGNOSIS — D649 Anemia, unspecified: Secondary | ICD-10-CM | POA: Diagnosis not present

## 2019-11-26 DIAGNOSIS — R1310 Dysphagia, unspecified: Secondary | ICD-10-CM | POA: Diagnosis not present

## 2019-12-11 DIAGNOSIS — E113512 Type 2 diabetes mellitus with proliferative diabetic retinopathy with macular edema, left eye: Secondary | ICD-10-CM | POA: Diagnosis not present

## 2019-12-11 DIAGNOSIS — H4912 Fourth [trochlear] nerve palsy, left eye: Secondary | ICD-10-CM | POA: Diagnosis not present

## 2019-12-11 DIAGNOSIS — E113411 Type 2 diabetes mellitus with severe nonproliferative diabetic retinopathy with macular edema, right eye: Secondary | ICD-10-CM | POA: Diagnosis not present

## 2019-12-11 DIAGNOSIS — H35373 Puckering of macula, bilateral: Secondary | ICD-10-CM | POA: Diagnosis not present

## 2019-12-18 DIAGNOSIS — M549 Dorsalgia, unspecified: Secondary | ICD-10-CM | POA: Diagnosis not present

## 2019-12-18 DIAGNOSIS — E1140 Type 2 diabetes mellitus with diabetic neuropathy, unspecified: Secondary | ICD-10-CM | POA: Diagnosis not present

## 2019-12-18 DIAGNOSIS — E11319 Type 2 diabetes mellitus with unspecified diabetic retinopathy without macular edema: Secondary | ICD-10-CM | POA: Diagnosis not present

## 2019-12-18 DIAGNOSIS — I2510 Atherosclerotic heart disease of native coronary artery without angina pectoris: Secondary | ICD-10-CM | POA: Diagnosis not present

## 2019-12-21 NOTE — Progress Notes (Deleted)
Cardiology Office Note:    Date:  12/21/2019   ID:  Corey Silva, DOB [birth year], MRN [id number]  PCP:  Erick Alley, MD  Cardiologist:  Little Ishikawa, MD  Electrophysiologist:  None   Referring MD: Erick Alley, MD   No chief complaint on file.   History of Present Illness:    Corey Silva is a 59 y.o. male with a hx of CAD, cirrhosis, type 2 diabetes, hypertension,hyperlipidemia who presents for follow-up.  He was referred by Dr. Nelva Nay for evaluation of chest pain, initially seen on 07/14/2019.  Cath 11/2013 at [facility] in [place] showed 60% in-stent restenosis in proximal LAD, 20% in-stent restenosis in D1, 90% stenosis in distal RCA.  S/p DES to distal RCA.  Lexiscan Myoview on 07/23/2019 showed basal to mid anterior ischemia.  Cath on 08/05/2019 showed proximal RCA 30%, mid RCA 30%, RPL 40%, patent distal RCA stent, patent RPAV stent, RPAV 60%, mid LAD 50%, 100% D1 fills by left to left collaterals, 95% ostial D2 not amenable to PCI, normal LV systolic function.  ABIs 09/01/2019 showed noncompressible vessels with normal TBI's.  Since last clinic visit,   Imdur was added to antianginal regimen.  Since that time, he reports his chest pain is unchanged.  Continues to have pain 1-2 times per week.  Lasts for 5 to 10 minutes and resolves.  Described as burning on left side of his chest.  Can occur at rest or with exertion.  Reports that most exertion he does is walking around his house or at the grocery store.  Occasional lightheadedness.  He has had some minor headaches since starting Imdur, but denies anything significant.  He has continued to take aspirin and Plavix.  Reports intermittent epistaxis, otherwise denies any bleeding issues.   Past Medical History:  Diagnosis Date  . Cirrhosis (HCC)   . Coronary artery disease   . Diabetes mellitus without complication (HCC)   . Hypertension       Current Medications: No outpatient medications have been marked as taking for the  12/26/19 encounter (Appointment) with Little Ishikawa, MD.     Allergies:   Sm non-asprin sinus [pseudoephedrine-acetaminophen]   Social History   Socioeconomic History  . Marital status: Married    Spouse name: Not on file  . Number of children: Not on file  . Years of education: Not on file  . Highest education level: Not on file  Occupational History  . Not on file  Tobacco Use  . Smoking status: Never Smoker  . Smokeless tobacco: Never Used  Substance and Sexual Activity  . Alcohol use: Not Currently  . Drug use: Never  . Sexual activity: Yes  Other Topics Concern  . Not on file  Social History Narrative  . Not on file   Social Determinants of Health   Financial Resource Strain:   . Difficulty of Paying Living Expenses:   Food Insecurity:   . Worried About Programme researcher, broadcasting/film/video in the Last Year:   . Barista in the Last Year:   Transportation Needs:   . Freight forwarder (Medical):   Marland Kitchen Lack of Transportation (Non-Medical):   Physical Activity:   . Days of Exercise per Week:   . Minutes of Exercise per Session:   Stress:   . Feeling of Stress :   Social Connections:   . Frequency of Communication with Friends and Family:   . Frequency of Social Gatherings with Friends and Family:   .  Attends Religious Services:   . Active Member of Clubs or Organizations:   . Attends Banker Meetings:   Marland Kitchen Marital Status:      Family History: No relevant family history ROS:   Please see the history of present illness.     All other systems reviewed and are negative.  EKGs/Labs/Other Studies Reviewed:    The following studies were reviewed today:  EKG:  EKG is ordered today.  The ekg ordered today demonstrates normal sinus rhythm,  rate 76, no ST abnormalities  Lexiscan Myoview 07/23/2019:  The left ventricular ejection fraction is normal (55-65%).  Nuclear stress EF: 55%.  There was no ST segment deviation noted during stress.  No T  wave inversion was noted during stress.  Findings consistent with ischemia.  This is an intermediate risk study.   Impression:  1. Diaphragm attenuation artifact.  2. There is a medium size (6-8%), moderate perfusion defect present in the basal to mid anterior wall consistent with ischemia.  3. No evidence of prior infarction.  4. Normal LVEF, 55%.  5. This is an intermediate risk study given the patient's history of CAD/PCI.   Cath 08/04/29:  Previously placed Dist RCA stent (unknown type) is widely patent.  Prox RCA lesion is 30% stenosed.  Mid RCA-1 lesion is 30% stenosed.  Mid RCA-2 lesion is 35% stenosed.  1st RPL lesion is 40% stenosed.  Previously placed RPAV-1 stent (unknown type) is widely patent.  RPAV-2 lesion is 60% stenosed.  Mid LAD lesion is 50% stenosed.  1st Diag lesion is 100% stenosed.  2nd Diag lesion is 95% stenosed.  The left ventricular systolic function is normal.  LV end diastolic pressure is normal.  The left ventricular ejection fraction is 55-65% by visual estimate.   1. Single vessel obstructive CAD    - Stents in proximal to mid LAD are patent with focal area of 50% narrowing.    - 100% first diagonal. Fills by left to left collaterals.    - 95% ostial small second diagonal    - stent in distal RCA is patent. 2. Normal LV function 3. Normal LVEDP  Plan: compared to last cath report from [place] in 2017 there is no significant change. Recommend continued medical therapy. Area of ischemia noted on stress testing corresponds to diagonal disease that is not suitable for PCI.     ABIs 09/01/19: Right: Resting right ankle-brachial index indicates noncompressible right  lower extremity arteries. The right toe-brachial index is normal.   Left: Resting left ankle-brachial index indicates noncompressible left  lower extremity arteries. The left toe-brachial index is normal.   Recent Labs: 08/01/2019: BUN 26; Creatinine, Ser 0.85; Hemoglobin  15.3; Platelets 150; Potassium 4.5; Sodium 140  Recent Lipid Panel No results found for: CHOL, TRIG, HDL, CHOLHDL, VLDL, LDLCALC, LDLDIRECT  Physical Exam:    VS:  There were no vitals taken for this visit.    Wt Readings from Last 3 Encounters:  09/17/19 261 lb 6.4 oz (118.6 kg)  08/19/19 260 lb (117.9 kg)  08/05/19 260 lb (117.9 kg)     GEN:  Well nourished, well developed in no acute distress HEENT: Normal NECK: No JVD LYMPHATICS: No lymphadenopathy CARDIAC: RRR, 2/6 systolic murmur RESPIRATORY:  Clear to auscultation without rales, wheezing or rhonchi  ABDOMEN: Soft, non-tender, non-distended MUSCULOSKELETAL:  No edema; No deformity  SKIN: Warm and dry NEUROLOGIC:  Alert and oriented x 3 PSYCHIATRIC:  Normal affect   ASSESSMENT:    No diagnosis  found. PLAN:     CAD: Status post mid LAD and distal RCA stenting.  Currently with atypical angina.  Lexiscan Myoview showed anterior ischemia.  Cath on 08/05/2019 showed patent stents, occluded D1, 95% D2 not amenable to intervention.  Medical management recommended.  Continues to have intermittent chest pain. -Continue ASA 81 mg,Plavix 75 mg daily, rosuvastatin 20 mg daily -Continue metoprolol 50 mg twice daily -Continue Imdur 60 mg daily -SL NTG prn  Hypertension: On HCTZ 25 mg daily, Imdur 60 mg daily, losartan 100 mg daily, metoprolol 50 mg twice daily  Type 2 diabetes: On insulin.  A1c 6.9 on 06/04/2019  Hyperlipidemia: On rosuvastatin 20 mg daily.  LDL 44 on 05/29/2019  Leg pain: ABIs show noncompressible vessels but normal TBI's.  RTC in***  Medication Adjustments/Labs and Tests Ordered: Current medicines are reviewed at length with the patient today.  Concerns regarding medicines are outlined above.  No orders of the defined types were placed in this encounter.  No orders of the defined types were placed in this encounter.   There are no Patient Instructions on file for this visit.   Signed, Little Ishikawa, MD  12/21/2019 1:50 PM    [facility]

## 2019-12-26 ENCOUNTER — Ambulatory Visit: Payer: No Typology Code available for payment source | Admitting: Cardiology

## 2020-02-11 ENCOUNTER — Other Ambulatory Visit: Payer: Self-pay

## 2020-02-11 ENCOUNTER — Encounter: Payer: Self-pay | Admitting: Cardiology

## 2020-02-11 ENCOUNTER — Ambulatory Visit (INDEPENDENT_AMBULATORY_CARE_PROVIDER_SITE_OTHER): Payer: Self-pay | Admitting: Cardiology

## 2020-02-11 VITALS — BP 130/72 | HR 80 | Ht 73.0 in | Wt 261.0 lb

## 2020-02-11 DIAGNOSIS — M79606 Pain in leg, unspecified: Secondary | ICD-10-CM

## 2020-02-11 DIAGNOSIS — E785 Hyperlipidemia, unspecified: Secondary | ICD-10-CM

## 2020-02-11 DIAGNOSIS — I25119 Atherosclerotic heart disease of native coronary artery with unspecified angina pectoris: Secondary | ICD-10-CM

## 2020-02-11 DIAGNOSIS — I10 Essential (primary) hypertension: Secondary | ICD-10-CM

## 2020-02-11 NOTE — Patient Instructions (Signed)

## 2020-02-11 NOTE — Progress Notes (Signed)
Cardiology Office Note:    Date:  02/11/2020   ID:  Corey Silva, DOB [birth year], MRN [id number]  PCP:  Corey Silva, Corey H, MD  Cardiologist:  Corey Ishikawahristopher L Doak Mah, MD  Electrophysiologist:  None   Referring MD: Corey Silva, Corey H, MD   Chief Complaint  Patient presents with  . Coronary Artery Disease    History of Present Illness:    Corey CreekMark Stanly is a 59 y.o. male with a hx of CAD, cirrhosis, type 2 diabetes, hypertension,hyperlipidemia who presents for follow-up.  He was referred by Dr. Nelva NaySemel for evaluation of chest pain, initially seen on 07/14/2019.  [facility] [place] showed 60% in-stent restenosis in proximal LAD, 20% in-stent restenosis in D1, 90% stenosis in distal RCA.  S/p DES to distal RCA.  Lexiscan Myoview on 07/23/2019 showed basal to mid anterior ischemia.  Cath on 08/05/2019 showed proximal RCA 30%, mid RCA 30%, RPL 40%, patent distal RCA stent, patent RPAV stent, RPAV 60%, mid LAD 50%, 100% D1 fills by left to left collaterals, 95% ostial D2 not amenable to PCI, normal LV systolic function.  ABIs 09/01/2019 showed noncompressible vessels with normal TBI's.  Since last clinic visit, he reports he has been doing okay. He continues to have chest pain, occurs about once every 3 weeks. Describes stabbing pain that last for few seconds and resolves. Can occur at rest or with exertion. Does report has been having some shortness of breath. Has not been exercising due to pain in his feet. Continues on DAPT, reports occasional nosebleeds. Denies any other bleeding issues.    Past Medical History:  Diagnosis Date  . Cirrhosis (HCC)   . Coronary artery disease   . Diabetes mellitus without complication (HCC)   . Hypertension       Current Medications: Current Meds  Medication Sig  . Apoaequorin (PREVAGEN EXTRA STRENGTH) 20 MG CAPS Take 1 tablet by mouth daily.  Marland Kitchen. aspirin EC 81 MG tablet Take 81 mg by mouth daily.  . busPIRone (BUSPAR) 10 MG tablet Take 20 mg by mouth 2 (two)  times daily.   . carboxymethylcellulose (REFRESH PLUS) 0.5 % SOLN Place 1 drop into both eyes 3 (three) times daily as needed (Dry eyes).  . clopidogrel (PLAVIX) 75 MG tablet Take 75 mg by mouth daily.   . DORZOLAMIDE HCL-TIMOLOL MAL OP Place 1 drop into the left eye daily.  . DULoxetine (CYMBALTA) 60 MG capsule Take 60 mg by mouth 2 (two) times daily.   Marland Kitchen. gabapentin (NEURONTIN) 300 MG capsule Take 900 mg by mouth 4 (four) times daily.   Marland Kitchen. HUMULIN R U-500 KWIKPEN 500 UNIT/ML kwikpen Inject into the skin.  . hydrochlorothiazide (HYDRODIURIL) 25 MG tablet Take 25 mg by mouth daily.  . insulin regular human CONCENTRATED (HUMULIN R) 500 UNIT/ML injection Inject 30 Units into the skin 3 (three) times daily with meals.  Marland Kitchen. losartan (COZAAR) 100 MG tablet Take 100 mg by mouth daily.  . metFORMIN (GLUCOPHAGE-XR) 500 MG 24 hr tablet Take 2 tablets (1,000 mg total) by mouth in the morning and at bedtime.  . metoprolol tartrate (LOPRESSOR) 50 MG tablet Take 1 tablet (50 mg total) by mouth 2 (two) times daily.  . Multiple Vitamins-Minerals (MULTIVITAMIN WITH MINERALS) tablet Take 1 tablet by mouth daily.  . Multiple Vitamins-Minerals (PRESERVISION AREDS PO) Take 1 tablet by mouth in the morning and at bedtime.  Marland Kitchen. omeprazole (PRILOSEC) 20 MG capsule Take 20 mg by mouth daily.  Marland Kitchen. OVER THE COUNTER MEDICATION  Take 3 tablets by mouth daily. testboost  . OZEMPIC, 0.25 OR 0.5 MG/DOSE, 2 MG/1.5ML SOPN Inject into the skin.  . pregabalin (LYRICA) 225 MG capsule Take 225 mg by mouth 2 (two) times daily.   . rosuvastatin (CRESTOR) 40 MG tablet Take 20 mg by mouth daily.  Marland Kitchen SEMAGLUTIDE, 1 MG/DOSE, [place] Inject 0.5 mg into the skin every Sunday.   . tamsulosin (FLOMAX) 0.4 MG CAPS capsule Take 0.8 mg by mouth at bedtime.  . vitamin B-12 (CYANOCOBALAMIN) 1000 MCG tablet Take 1,000 mcg by mouth daily.  . Vitamin D, Cholecalciferol, 25 MCG (1000 UT) TABS Take 1,000 mg by mouth daily.      Allergies:   Sm non-asprin sinus  [pseudoephedrine-acetaminophen]   Social History   Socioeconomic History  . Marital status: Married    Spouse name: Not on file  . Number of children: Not on file  . Years of education: Not on file  . Highest education level: Not on file  Occupational History  . Not on file  Tobacco Use  . Smoking status: Never Smoker  . Smokeless tobacco: Never Used  Substance and Sexual Activity  . Alcohol use: Not Currently  . Drug use: Never  . Sexual activity: Yes  Other Topics Concern  . Not on file  Social History Narrative  . Not on file   Social Determinants of Health   Financial Resource Strain:   . Difficulty of Paying Living Expenses: Not on file  Food Insecurity:   . Worried About Programme researcher, broadcasting/film/video in the Last Year: Not on file  . Ran Out of Food in the Last Year: Not on file  Transportation Needs:   . Lack of Transportation (Medical): Not on file  . Lack of Transportation (Non-Medical): Not on file  Physical Activity:   . Days of Exercise per Week: Not on file  . Minutes of Exercise per Session: Not on file  Stress:   . Feeling of Stress : Not on file  Social Connections:   . Frequency of Communication with Friends and Family: Not on file  . Frequency of Social Gatherings with Friends and Family: Not on file  . Attends Religious Services: Not on file  . Active Member of Clubs or Organizations: Not on file  . Attends Banker Meetings: Not on file  . Marital Status: Not on file     Family History: No relevant family history ROS:   Please see the history of present illness.     All other systems reviewed and are negative.  EKGs/Labs/Other Studies Reviewed:    The following studies were reviewed today:  EKG:  EKG is ordered today.  The ekg ordered today demonstrates normal sinus rhythm,  rate 80, nonspecific T wave flattening  Lexiscan Myoview 07/23/2019:  The left ventricular ejection fraction is normal (55-65%).  Nuclear stress EF:  55%.  There was no ST segment deviation noted during stress.  No T wave inversion was noted during stress.  Findings consistent with ischemia.  This is an intermediate risk study.   Impression:  1. Diaphragm attenuation artifact.  2. There is a medium size (6-8%), moderate perfusion defect present in the basal to mid anterior wall consistent with ischemia.  3. No evidence of prior infarction.  4. Normal LVEF, 55%.  5. This is an intermediate risk study given the patient's history of CAD/PCI.   Cath 08/04/29:  Previously placed Dist RCA stent (unknown type) is widely patent.  Prox RCA lesion  is 30% stenosed.  Mid RCA-1 lesion is 30% stenosed.  Mid RCA-2 lesion is 35% stenosed.  1st RPL lesion is 40% stenosed.  Previously placed RPAV-1 stent (unknown type) is widely patent.  RPAV-2 lesion is 60% stenosed.  Mid LAD lesion is 50% stenosed.  1st Diag lesion is 100% stenosed.  2nd Diag lesion is 95% stenosed.  The left ventricular systolic function is normal.  LV end diastolic pressure is normal.  The left ventricular ejection fraction is 55-65% by visual estimate.   1. Single vessel obstructive CAD    - Stents in proximal to mid LAD are patent with focal area of 50% narrowing.    - 100% first diagonal. Fills by left to left collaterals.    - 95% ostial small second diagonal    - stent in distal RCA is patent. 2. Normal LV function 3. Normal LVEDP  Plan: compared to last cath report from [place] in 2017 there is no significant change. Recommend continued medical therapy. Area of ischemia noted on stress testing corresponds to diagonal disease that is not suitable for PCI.     ABIs 09/01/19: Right: Resting right ankle-brachial index indicates noncompressible right  lower extremity arteries. The right toe-brachial index is normal.   Left: Resting left ankle-brachial index indicates noncompressible left  lower extremity arteries. The left toe-brachial index is  normal.   Recent Labs: 08/01/2019: BUN 26; Creatinine, Ser 0.85; Hemoglobin 15.3; Platelets 150; Potassium 4.5; Sodium 140  Recent Lipid Panel No results found for: CHOL, TRIG, HDL, CHOLHDL, VLDL, LDLCALC, LDLDIRECT  Physical Exam:    VS:  BP 130/72   Pulse 80   Ht 6\' 1"  (1.854 m)   Wt 261 lb (118.4 kg)   SpO2 94%   BMI 34.43 kg/m     Wt Readings from Last 3 Encounters:  02/11/20 261 lb (118.4 kg)  09/17/19 261 lb 6.4 oz (118.6 kg)  08/19/19 260 lb (117.9 kg)     GEN:  Well nourished, well developed in no acute distress HEENT: Normal NECK: No JVD LYMPHATICS: No lymphadenopathy CARDIAC: RRR, 2/6 systolic murmur RESPIRATORY:  Clear to auscultation without rales, wheezing or rhonchi  ABDOMEN: Soft, non-tender, non-distended MUSCULOSKELETAL:  No edema; No deformity  SKIN: Warm and dry NEUROLOGIC:  Alert and oriented x 3 PSYCHIATRIC:  Normal affect   ASSESSMENT:    1. Coronary artery disease involving native coronary artery of native heart with angina pectoris (HCC)   2. Essential hypertension   3. Hyperlipidemia, unspecified hyperlipidemia type   4. Pain of lower extremity, unspecified laterality    PLAN:     CAD: Status post mid LAD and distal RCA stenting.  Lexiscan Myoview showed anterior ischemia.  Cath on 08/05/2019 showed patent stents, occluded D1, 95% D2 not amenable to intervention.  Medical management recommended.  Currently with chest pain that sounds noncardiac, as describes sharp pain that lasts for few seconds and resolves. -Continue ASA 81 mg,Plavix 75 mg daily, rosuvastatin 20 mg daily -Continue metoprolol 50 mg twice daily -Continue Imdur 60 mg daily -SL NTG prn  Hypertension: appears controlled, continue current meds  Type 2 diabetes: On insulin.  A1c 6.9 on 06/04/2019  Hyperlipidemia: On rosuvastatin 20 mg daily.  LDL 44 on 05/29/2019  Leg pain: ABIs show noncompressible vessels but normal TBI's.   RTC in 6 months  Medication Adjustments/Labs  and Tests Ordered: Current medicines are reviewed at length with the patient today.  Concerns regarding medicines are outlined above.  No orders of the  defined types were placed in this encounter.  No orders of the defined types were placed in this encounter.   There are no Patient Instructions on file for this visit.   Signed, Corey Ishikawa, MD  02/11/2020 2:09 PM     [facility]

## 2020-02-12 NOTE — Addendum Note (Signed)
Addended by: Myna Hidalgo A on: 02/12/2020 10:53 AM   Modules accepted: Orders

## 2020-02-19 DIAGNOSIS — H4912 Fourth [trochlear] nerve palsy, left eye: Secondary | ICD-10-CM | POA: Diagnosis not present

## 2020-02-26 DIAGNOSIS — E113512 Type 2 diabetes mellitus with proliferative diabetic retinopathy with macular edema, left eye: Secondary | ICD-10-CM | POA: Diagnosis not present

## 2020-02-26 DIAGNOSIS — E113411 Type 2 diabetes mellitus with severe nonproliferative diabetic retinopathy with macular edema, right eye: Secondary | ICD-10-CM | POA: Diagnosis not present

## 2020-02-26 DIAGNOSIS — H35373 Puckering of macula, bilateral: Secondary | ICD-10-CM | POA: Diagnosis not present

## 2020-02-26 DIAGNOSIS — H4912 Fourth [trochlear] nerve palsy, left eye: Secondary | ICD-10-CM | POA: Diagnosis not present

## 2020-05-11 ENCOUNTER — Telehealth: Payer: Self-pay | Admitting: *Deleted

## 2020-05-11 NOTE — Telephone Encounter (Signed)
A message was left, re: his follow up visit. 

## 2020-05-20 DIAGNOSIS — E113512 Type 2 diabetes mellitus with proliferative diabetic retinopathy with macular edema, left eye: Secondary | ICD-10-CM | POA: Diagnosis not present

## 2020-05-20 DIAGNOSIS — E113411 Type 2 diabetes mellitus with severe nonproliferative diabetic retinopathy with macular edema, right eye: Secondary | ICD-10-CM | POA: Diagnosis not present

## 2020-05-20 DIAGNOSIS — H4612 Retrobulbar neuritis, left eye: Secondary | ICD-10-CM | POA: Diagnosis not present

## 2020-05-20 DIAGNOSIS — H35373 Puckering of macula, bilateral: Secondary | ICD-10-CM | POA: Diagnosis not present

## 2020-06-15 DIAGNOSIS — I2510 Atherosclerotic heart disease of native coronary artery without angina pectoris: Secondary | ICD-10-CM | POA: Diagnosis not present

## 2020-06-15 DIAGNOSIS — Z23 Encounter for immunization: Secondary | ICD-10-CM | POA: Diagnosis not present

## 2020-06-15 DIAGNOSIS — E1140 Type 2 diabetes mellitus with diabetic neuropathy, unspecified: Secondary | ICD-10-CM | POA: Diagnosis not present

## 2020-06-15 DIAGNOSIS — Z794 Long term (current) use of insulin: Secondary | ICD-10-CM | POA: Diagnosis not present

## 2020-06-15 DIAGNOSIS — E11319 Type 2 diabetes mellitus with unspecified diabetic retinopathy without macular edema: Secondary | ICD-10-CM | POA: Diagnosis not present

## 2020-07-02 DIAGNOSIS — H2513 Age-related nuclear cataract, bilateral: Secondary | ICD-10-CM | POA: Diagnosis not present

## 2020-07-20 DIAGNOSIS — Z01812 Encounter for preprocedural laboratory examination: Secondary | ICD-10-CM | POA: Diagnosis not present

## 2020-07-22 DIAGNOSIS — Z7984 Long term (current) use of oral hypoglycemic drugs: Secondary | ICD-10-CM | POA: Diagnosis not present

## 2020-07-22 DIAGNOSIS — H25813 Combined forms of age-related cataract, bilateral: Secondary | ICD-10-CM | POA: Diagnosis not present

## 2020-07-22 DIAGNOSIS — Z794 Long term (current) use of insulin: Secondary | ICD-10-CM | POA: Diagnosis not present

## 2020-07-22 DIAGNOSIS — E1136 Type 2 diabetes mellitus with diabetic cataract: Secondary | ICD-10-CM | POA: Diagnosis not present

## 2020-07-29 DIAGNOSIS — H35373 Puckering of macula, bilateral: Secondary | ICD-10-CM | POA: Diagnosis not present

## 2020-07-29 DIAGNOSIS — E113511 Type 2 diabetes mellitus with proliferative diabetic retinopathy with macular edema, right eye: Secondary | ICD-10-CM | POA: Diagnosis not present

## 2020-07-29 DIAGNOSIS — H4912 Fourth [trochlear] nerve palsy, left eye: Secondary | ICD-10-CM | POA: Diagnosis not present

## 2020-07-29 DIAGNOSIS — E113412 Type 2 diabetes mellitus with severe nonproliferative diabetic retinopathy with macular edema, left eye: Secondary | ICD-10-CM | POA: Diagnosis not present

## 2020-08-06 DIAGNOSIS — Z961 Presence of intraocular lens: Secondary | ICD-10-CM | POA: Diagnosis not present

## 2020-08-06 DIAGNOSIS — E113491 Type 2 diabetes mellitus with severe nonproliferative diabetic retinopathy without macular edema, right eye: Secondary | ICD-10-CM | POA: Diagnosis not present

## 2020-08-06 DIAGNOSIS — H25812 Combined forms of age-related cataract, left eye: Secondary | ICD-10-CM | POA: Diagnosis not present

## 2020-08-06 DIAGNOSIS — E113592 Type 2 diabetes mellitus with proliferative diabetic retinopathy without macular edema, left eye: Secondary | ICD-10-CM | POA: Diagnosis not present

## 2020-08-06 DIAGNOSIS — Z48810 Encounter for surgical aftercare following surgery on the sense organs: Secondary | ICD-10-CM | POA: Diagnosis not present

## 2020-08-08 NOTE — Progress Notes (Addendum)
Cardiology Office Note:    Date:  08/11/2020   ID:  [name], DOB [birth year], MRN [id number]  PCP:  Erick Alley, MD  Cardiologist:  Little Ishikawa, MD  Electrophysiologist:  None   Referring MD: Erick Alley, MD   Chief Complaint  Patient presents with  . Coronary Artery Disease    History of Present Illness:    Salam Micucci is a 60 y.o. male with a hx of CAD, cirrhosis, type 2 diabetes, hypertension,hyperlipidemia who presents for follow-up.  He was referred by Dr. Nelva Nay for evaluation of chest pain, initially seen on 07/14/2019.  Cath 11/2013 at [facility] in [place] showed 60% in-stent restenosis in proximal LAD, 20% in-stent restenosis in D1, 90% stenosis in distal RCA.  S/p DES to distal RCA.  Lexiscan Myoview on 07/23/2019 showed basal to mid anterior ischemia.  Cath on 08/05/2019 showed proximal RCA 30%, mid RCA 30%, RPL 40%, patent distal RCA stent, patent RPAV stent, RPAV 60%, mid LAD 50%, 100% D1 fills by left to left collaterals, 95% ostial D2 not amenable to PCI, normal LV systolic function.  ABIs 09/01/2019 showed noncompressible vessels with normal TBI's.  Since last clinic visit, he reports that he has been doing okay.  Does report a couple episodes of chest pain.  Describes as pain in upper left-side of chest, described as twisting pain that occurs when he starts walking.  Does not happen reliably when he walks.  Reports episode lasted about a minute resolved with rest.  He has not been walking much due to his pain in his feet.  Also reports dyspnea during episodes.  Does have intermittent lightheadedness when he stands, denies any syncope.  Reports has been having palpitations that he described as feeling like his heart is racing.  Can occur up to 2-3 times per week.  Typically last for about 1 minute or so.  Denies any lower extremity edema.  Continues to take DAPT, reports frequent bruising.  Denies any blood in stool.   Past Medical History:  Diagnosis Date  . Cirrhosis  (HCC)   . Coronary artery disease   . Diabetes mellitus without complication (HCC)   . Hypertension       Current Medications: Current Meds  Medication Sig  . Apoaequorin (PREVAGEN EXTRA STRENGTH) 20 MG CAPS Take 1 tablet by mouth daily.  Marland Kitchen aspirin EC 81 MG tablet Take 81 mg by mouth daily.  . busPIRone (BUSPAR) 10 MG tablet Take 20 mg by mouth 2 (two) times daily.   . carboxymethylcellulose (REFRESH PLUS) 0.5 % SOLN Place 1 drop into both eyes 3 (three) times daily as needed (Dry eyes).  . clopidogrel (PLAVIX) 75 MG tablet Take 75 mg by mouth daily.   . DORZOLAMIDE HCL-TIMOLOL MAL OP Place 1 drop into the left eye daily.  . DULoxetine (CYMBALTA) 60 MG capsule Take 60 mg by mouth 2 (two) times daily.   Marland Kitchen gabapentin (NEURONTIN) 300 MG capsule Take 900 mg by mouth 4 (four) times daily.   Marland Kitchen HUMULIN R U-500 KWIKPEN 500 UNIT/ML kwikpen Inject into the skin.  . hydrochlorothiazide (HYDRODIURIL) 25 MG tablet Take 25 mg by mouth daily.  . insulin regular human CONCENTRATED (HUMULIN R) 500 UNIT/ML injection Inject 30 Units into the skin 3 (three) times daily with meals.  Marland Kitchen losartan (COZAAR) 100 MG tablet Take 100 mg by mouth daily.  . metFORMIN (GLUCOPHAGE-XR) 500 MG 24 hr tablet Take 2 tablets (1,000 mg total) by mouth in the morning and at  bedtime.  . Multiple Vitamins-Minerals (MULTIVITAMIN WITH MINERALS) tablet Take 1 tablet by mouth daily.  . Multiple Vitamins-Minerals (PRESERVISION AREDS PO) Take 1 tablet by mouth in the morning and at bedtime.  Marland Kitchen omeprazole (PRILOSEC) 20 MG capsule Take 20 mg by mouth daily.  Marland Kitchen OVER THE COUNTER MEDICATION Take 3 tablets by mouth daily. testboost  . OZEMPIC, 0.25 OR 0.5 MG/DOSE, 2 MG/1.5ML SOPN Inject into the skin.  . pregabalin (LYRICA) 225 MG capsule Take 225 mg by mouth 2 (two) times daily.   . rosuvastatin (CRESTOR) 40 MG tablet Take 20 mg by mouth daily.  Marland Kitchen SEMAGLUTIDE, 1 MG/DOSE, [place] Inject 0.5 mg into the skin every Sunday.   . tamsulosin (FLOMAX)  0.4 MG CAPS capsule Take 0.8 mg by mouth at bedtime.  . vitamin B-12 (CYANOCOBALAMIN) 1000 MCG tablet Take 1,000 mcg by mouth daily.  . Vitamin D, Cholecalciferol, 25 MCG (1000 UT) TABS Take 1,000 mg by mouth daily.      Allergies:   Sm non-asprin sinus [pseudoephedrine-acetaminophen]   Social History   Socioeconomic History  . Marital status: Married    Spouse name: Not on file  . Number of children: Not on file  . Years of education: Not on file  . Highest education level: Not on file  Occupational History  . Not on file  Tobacco Use  . Smoking status: Never Smoker  . Smokeless tobacco: Never Used  Substance and Sexual Activity  . Alcohol use: Not Currently  . Drug use: Never  . Sexual activity: Yes  Other Topics Concern  . Not on file  Social History Narrative  . Not on file   Social Determinants of Health   Financial Resource Strain: Not on file  Food Insecurity: Not on file  Transportation Needs: Not on file  Physical Activity: Not on file  Stress: Not on file  Social Connections: Not on file     Family History: No relevant family history ROS:   Please see the history of present illness.     All other systems reviewed and are negative.  EKGs/Labs/Other Studies Reviewed:    The following studies were reviewed today:  EKG:  EKG is ordered today.  The ekg ordered today demonstrates normal sinus rhythm,  rate 74, no ST abnormalities  Lexiscan Myoview 07/23/2019:  The left ventricular ejection fraction is normal (55-65%).  Nuclear stress EF: 55%.  There was no ST segment deviation noted during stress.  No T wave inversion was noted during stress.  Findings consistent with ischemia.  This is an intermediate risk study.   Impression:  1. Diaphragm attenuation artifact.  2. There is a medium size (6-8%), moderate perfusion defect present in the basal to mid anterior wall consistent with ischemia.  3. No evidence of prior infarction.  4. Normal LVEF,  55%.  5. This is an intermediate risk study given the patient's history of CAD/PCI.   Cath 08/04/29:  Previously placed Dist RCA stent (unknown type) is widely patent.  Prox RCA lesion is 30% stenosed.  Mid RCA-1 lesion is 30% stenosed.  Mid RCA-2 lesion is 35% stenosed.  1st RPL lesion is 40% stenosed.  Previously placed RPAV-1 stent (unknown type) is widely patent.  RPAV-2 lesion is 60% stenosed.  Mid LAD lesion is 50% stenosed.  1st Diag lesion is 100% stenosed.  2nd Diag lesion is 95% stenosed.  The left ventricular systolic function is normal.  LV end diastolic pressure is normal.  The left ventricular ejection fraction is  55-65% by visual estimate.   1. Single vessel obstructive CAD    - Stents in proximal to mid LAD are patent with focal area of 50% narrowing.    - 100% first diagonal. Fills by left to left collaterals.    - 95% ostial small second diagonal    - stent in distal RCA is patent. 2. Normal LV function 3. Normal LVEDP  Plan: compared to last cath report from [place] in 2017 there is no significant change. Recommend continued medical therapy. Area of ischemia noted on stress testing corresponds to diagonal disease that is not suitable for PCI.     ABIs 09/01/19: Right: Resting right ankle-brachial index indicates noncompressible right  lower extremity arteries. The right toe-brachial index is normal.   Left: Resting left ankle-brachial index indicates noncompressible left  lower extremity arteries. The left toe-brachial index is normal.   Recent Labs: No results found for requested labs within last 8760 hours.  Recent Lipid Panel No results found for: CHOL, TRIG, HDL, CHOLHDL, VLDL, LDLCALC, LDLDIRECT  Physical Exam:    VS:  BP 115/68   Pulse 74   Ht 6\' 1"  (1.854 m)   Wt 253 lb 6.4 oz (114.9 kg)   SpO2 99%   BMI 33.43 kg/m     Wt Readings from Last 3 Encounters:  08/11/20 253 lb 6.4 oz (114.9 kg)  02/11/20 261 lb (118.4 kg)  09/17/19  261 lb 6.4 oz (118.6 kg)     GEN:  Well nourished, well developed in no acute distress HEENT: Normal NECK: No JVD CARDIAC: RRR, 2/6 systolic murmur RESPIRATORY:  Clear to auscultation without rales, wheezing or rhonchi  ABDOMEN: Soft, non-tender, non-distended MUSCULOSKELETAL:  No edema; No deformity  SKIN: Warm and dry NEUROLOGIC:  Alert and oriented x 3 PSYCHIATRIC:  Normal affect   ASSESSMENT:    1. Coronary artery disease of native artery of native heart with stable angina pectoris (HCC)   2. Palpitations   3. Essential hypertension   4. Hyperlipidemia, unspecified hyperlipidemia type    PLAN:     CAD: Status post mid LAD and distal RCA stenting.  Lexiscan Myoview showed anterior ischemia.  Cath on 08/05/2019 showed patent stents, occluded D1, 95% D2 not amenable to intervention.  Medical management recommended.  Currently with intermittent chest pain -Continue ASA 81 mg,Plavix 75 mg daily, rosuvastatin 20 mg daily -Continue metoprolol, will consolidate to Toprol-XL 100 mg daily -Continue Imdur 60 mg daily -SL NTG prn -Check CMET, CBC  Palpitations: Description concerning for arrhythmia, will evaluate with Zio patch x2 weeks  Hypertension: appears controlled, continue hydrochlorothiazide 25 mg daily, Imdur 60 mg daily, metoprolol 100 mg daily, and losartan 100 mg  Type 2 diabetes: On insulin.  A1c 6.9 on 06/04/2019  Hyperlipidemia: On rosuvastatin 20 mg daily.  LDL 44 on 05/29/2019.  Will check lipid panel  Leg pain: ABIs show noncompressible vessels but normal TBI's.   RTC in 3 months  Medication Adjustments/Labs and Tests Ordered: Current medicines are reviewed at length with the patient today.  Concerns regarding medicines are outlined above.  No orders of the defined types were placed in this encounter.  No orders of the defined types were placed in this encounter.   There are no Patient Instructions on file for this visit.   Signed, 05/31/2019,  MD  08/11/2020 8:33 AM     [facility]

## 2020-08-11 ENCOUNTER — Encounter: Payer: Self-pay | Admitting: *Deleted

## 2020-08-11 ENCOUNTER — Ambulatory Visit: Payer: Self-pay

## 2020-08-11 ENCOUNTER — Other Ambulatory Visit: Payer: Self-pay

## 2020-08-11 ENCOUNTER — Ambulatory Visit (INDEPENDENT_AMBULATORY_CARE_PROVIDER_SITE_OTHER): Payer: No Typology Code available for payment source | Admitting: Cardiology

## 2020-08-11 ENCOUNTER — Encounter: Payer: Self-pay | Admitting: Cardiology

## 2020-08-11 VITALS — BP 115/68 | HR 74 | Ht 73.0 in | Wt 253.4 lb

## 2020-08-11 DIAGNOSIS — E785 Hyperlipidemia, unspecified: Secondary | ICD-10-CM | POA: Diagnosis not present

## 2020-08-11 DIAGNOSIS — R002 Palpitations: Secondary | ICD-10-CM | POA: Diagnosis not present

## 2020-08-11 DIAGNOSIS — I25118 Atherosclerotic heart disease of native coronary artery with other forms of angina pectoris: Secondary | ICD-10-CM

## 2020-08-11 DIAGNOSIS — I10 Essential (primary) hypertension: Secondary | ICD-10-CM | POA: Diagnosis not present

## 2020-08-11 LAB — CBC
Hematocrit: 29.7 % — ABNORMAL LOW (ref 37.5–51.0)
Hemoglobin: 7.9 g/dL — ABNORMAL LOW (ref 13.0–17.7)
MCH: 17.1 pg — ABNORMAL LOW (ref 26.6–33.0)
MCHC: 26.6 g/dL — ABNORMAL LOW (ref 31.5–35.7)
MCV: 64 fL — ABNORMAL LOW (ref 79–97)
Platelets: 120 10*3/uL — ABNORMAL LOW (ref 150–450)
RBC: 4.61 x10E6/uL (ref 4.14–5.80)
RDW: 18.1 % — ABNORMAL HIGH (ref 11.6–15.4)
WBC: 7.7 10*3/uL (ref 3.4–10.8)

## 2020-08-11 LAB — COMPREHENSIVE METABOLIC PANEL
ALT: 41 IU/L (ref 0–44)
AST: 46 IU/L — ABNORMAL HIGH (ref 0–40)
Albumin/Globulin Ratio: 2 (ref 1.2–2.2)
Albumin: 4.5 g/dL (ref 3.8–4.9)
Alkaline Phosphatase: 94 IU/L (ref 44–121)
BUN/Creatinine Ratio: 20 (ref 10–24)
BUN: 16 mg/dL (ref 8–27)
Bilirubin Total: 0.5 mg/dL (ref 0.0–1.2)
CO2: 24 mmol/L (ref 20–29)
Calcium: 9.2 mg/dL (ref 8.6–10.2)
Chloride: 99 mmol/L (ref 96–106)
Creatinine, Ser: 0.79 mg/dL (ref 0.76–1.27)
Globulin, Total: 2.3 g/dL (ref 1.5–4.5)
Glucose: 115 mg/dL — ABNORMAL HIGH (ref 65–99)
Potassium: 4.8 mmol/L (ref 3.5–5.2)
Sodium: 138 mmol/L (ref 134–144)
Total Protein: 6.8 g/dL (ref 6.0–8.5)
eGFR: 102 mL/min/{1.73_m2} (ref >59)

## 2020-08-11 LAB — LIPID PANEL
Chol/HDL Ratio: 2.8 ratio (ref 0.0–5.0)
Cholesterol, Total: 97 mg/dL — ABNORMAL LOW (ref 100–199)
HDL: 35 mg/dL — ABNORMAL LOW (ref >39)
LDL Chol Calc (NIH): 26 mg/dL (ref 0–99)
Triglycerides: 237 mg/dL — ABNORMAL HIGH (ref 0–149)
VLDL Cholesterol Cal: 36 mg/dL (ref 5–40)

## 2020-08-11 MED ORDER — METOPROLOL SUCCINATE ER 100 MG PO TB24: 100.0000 mg | ORAL_TABLET | Freq: Every day | ORAL | 3 refills | Status: DC

## 2020-08-11 NOTE — Patient Instructions (Signed)
Medication Instructions:  STOP metoprolol tartrate (Lopressor) START metoprolol succinate (Toprol XL) 100 mg once daily  *If you need a refill on your cardiac medications before your next appointment, please call your pharmacy*   Lab Work: CMET, CBC, Lipid today  If you have labs (blood work) drawn today and your tests are completely normal, you will receive your results only by: Marland Kitchen MyChart Message (if you have MyChart) OR . A paper copy in the mail If you have any lab test that is abnormal or we need to change your treatment, we will call you to review the results.   Testing/Procedures:  Christena Deem- Long Term Monitor Instructions   Your physician has requested you wear your ZIO patch monitor__14___days.   This is a single patch monitor.  [employer] supplies one patch monitor per enrollment.  Additional stickers are not available.   Please do not apply patch if you will be having a Nuclear Stress Test, Echocardiogram, Cardiac CT, MRI, or Chest Xray during the time frame you would be wearing the monitor. The patch cannot be worn during these tests.  You cannot remove and re-apply the ZIO XT patch monitor.   Your ZIO patch monitor will be sent USPS Priority mail from [facility] directly to your home address. The monitor may also be mailed to a PO BOX if home delivery is not available.   It may take 3-5 days to receive your monitor after you have been enrolled.   Once you have received you monitor, please review enclosed instructions.  Your monitor has already been registered assigning a specific monitor serial # to you.   Applying the monitor   Shave hair from upper left chest.   Hold abrader disc by orange tab.  Rub abrader in 40 strokes over left upper chest as indicated in your monitor instructions.   Clean area with 4 enclosed alcohol pads .  Use all pads to assure are is cleaned thoroughly.  Let dry.   Apply patch as indicated in monitor instructions.  Patch will be place  under collarbone on left side of chest with arrow pointing upward.   Rub patch adhesive wings for 2 minutes.Remove white label marked "1".  Remove white label marked "2".  Rub patch adhesive wings for 2 additional minutes.   While looking in a mirror, press and release button in center of patch.  A small green light will flash 3-4 times .  This will be your only indicator the monitor has been turned on.     Do not shower for the first 24 hours.  You may shower after the first 24 hours.   Press button if you feel a symptom. You will hear a small click.  Record Date, Time and Symptom in the Patient Log Book.   When you are ready to remove patch, follow instructions on last 2 pages of Patient Log Book.  Stick patch monitor onto last page of Patient Log Book.   Place Patient Log Book in [place] box.  Use locking tab on box and tape box closed securely.  The [employer] and [employer] has [employer] on it.  Please place in mailbox as soon as possible.  Your physician should have your test results approximately 7 days after the monitor has been mailed back to [facility].   Call [facility] Customer Care at [phone] if you have questions regarding your ZIO XT patch monitor.  Call them immediately if you see an orange light blinking on your monitor.  If your monitor falls off in less than 4 days contact our Monitor department at [phone].  If your monitor becomes loose or falls off after 4 days call [employer] at [phone] for suggestions on securing your monitor.     Follow-Up: At [facility], you and your health needs are our priority.  As part of our continuing mission to provide you with exceptional heart care, we have created designated Provider Care Teams.  These Care Teams include your primary Cardiologist (physician) and Advanced Practice Providers (APPs -  Physician Assistants and Nurse Practitioners) who all work together to provide you with the care you need, when you need  it.  We recommend signing up for the patient portal called "MyChart".  Sign up information is provided on this After Visit Summary.  MyChart is used to connect with patients for Virtual Visits (Telemedicine).  Patients are able to view lab/test results, encounter notes, upcoming appointments, etc.  Non-urgent messages can be sent to your provider as well.   To learn more about what you can do with MyChart, go to [email or web address].    Your next appointment:   3 month(s)  The format for your next appointment:   In Person  Provider:   Epifanio Lesches, MD

## 2020-08-11 NOTE — Progress Notes (Signed)
Patient ID: Corey Silva, male   DOB: October 15, 1960, 59 y.o.   MRN: [id number] Patient enrolled for [employer] to ship a 14 day ZIO XT monitor to his home. Letter with instructions and Patient Assistance Program information mailed to patient.

## 2020-08-12 ENCOUNTER — Emergency Department (HOSPITAL_COMMUNITY): Payer: No Typology Code available for payment source

## 2020-08-12 ENCOUNTER — Emergency Department (HOSPITAL_COMMUNITY)
Admission: EM | Admit: 2020-08-12 | Discharge: 2020-08-12 | Disposition: A | Discharge status: Home / Self Care | Payer: No Typology Code available for payment source | Attending: Emergency Medicine | Admitting: Emergency Medicine

## 2020-08-12 DIAGNOSIS — Z794 Long term (current) use of insulin: Secondary | ICD-10-CM | POA: Insufficient documentation

## 2020-08-12 DIAGNOSIS — Z7984 Long term (current) use of oral hypoglycemic drugs: Secondary | ICD-10-CM | POA: Insufficient documentation

## 2020-08-12 DIAGNOSIS — E1169 Type 2 diabetes mellitus with other specified complication: Secondary | ICD-10-CM | POA: Insufficient documentation

## 2020-08-12 DIAGNOSIS — Z7902 Long term (current) use of antithrombotics/antiplatelets: Secondary | ICD-10-CM | POA: Diagnosis not present

## 2020-08-12 DIAGNOSIS — Z7982 Long term (current) use of aspirin: Secondary | ICD-10-CM | POA: Diagnosis not present

## 2020-08-12 DIAGNOSIS — D649 Anemia, unspecified: Secondary | ICD-10-CM | POA: Insufficient documentation

## 2020-08-12 DIAGNOSIS — Z79899 Other long term (current) drug therapy: Secondary | ICD-10-CM | POA: Insufficient documentation

## 2020-08-12 DIAGNOSIS — R531 Weakness: Secondary | ICD-10-CM | POA: Diagnosis not present

## 2020-08-12 DIAGNOSIS — R9431 Abnormal electrocardiogram [ECG] [EKG]: Secondary | ICD-10-CM | POA: Diagnosis not present

## 2020-08-12 DIAGNOSIS — E785 Hyperlipidemia, unspecified: Secondary | ICD-10-CM | POA: Diagnosis not present

## 2020-08-12 DIAGNOSIS — I10 Essential (primary) hypertension: Secondary | ICD-10-CM | POA: Diagnosis not present

## 2020-08-12 DIAGNOSIS — R799 Abnormal finding of blood chemistry, unspecified: Secondary | ICD-10-CM | POA: Diagnosis present

## 2020-08-12 DIAGNOSIS — I25119 Atherosclerotic heart disease of native coronary artery with unspecified angina pectoris: Secondary | ICD-10-CM | POA: Insufficient documentation

## 2020-08-12 LAB — CBC WITH DIFFERENTIAL/PLATELET
Abs Immature Granulocytes: 0.03 10*3/uL (ref 0.00–0.07)
Basophils Absolute: 0.1 10*3/uL (ref 0.0–0.1)
Basophils Relative: 2 %
Eosinophils Absolute: 0.3 10*3/uL (ref 0.0–0.5)
Eosinophils Relative: 4 %
HCT: 27.3 % — ABNORMAL LOW (ref 39.0–52.0)
Hemoglobin: 7.2 g/dL — ABNORMAL LOW (ref 13.0–17.0)
Immature Granulocytes: 0 %
Lymphocytes Relative: 21 %
Lymphs Abs: 1.5 10*3/uL (ref 0.7–4.0)
MCH: 17.4 pg — ABNORMAL LOW (ref 26.0–34.0)
MCHC: 26.4 g/dL — ABNORMAL LOW (ref 30.0–36.0)
MCV: 66.1 fL — ABNORMAL LOW (ref 80.0–100.0)
Monocytes Absolute: 0.6 10*3/uL (ref 0.1–1.0)
Monocytes Relative: 9 %
Neutro Abs: 4.4 10*3/uL (ref 1.7–7.7)
Neutrophils Relative %: 64 %
Platelets: 107 10*3/uL — ABNORMAL LOW (ref 150–400)
RBC: 4.13 MIL/uL — ABNORMAL LOW (ref 4.22–5.81)
RDW: 19.7 % — ABNORMAL HIGH (ref 11.5–15.5)
WBC: 6.9 10*3/uL (ref 4.0–10.5)
nRBC: 0 % (ref 0.0–0.2)

## 2020-08-12 LAB — COMPREHENSIVE METABOLIC PANEL
ALT: 38 U/L (ref 0–44)
AST: 44 U/L — ABNORMAL HIGH (ref 15–41)
Albumin: 3.5 g/dL (ref 3.5–5.0)
Alkaline Phosphatase: 72 U/L (ref 38–126)
Anion gap: 7 (ref 5–15)
BUN: 14 mg/dL (ref 6–20)
CO2: 27 mmol/L (ref 22–32)
Calcium: 8.8 mg/dL — ABNORMAL LOW (ref 8.9–10.3)
Chloride: 103 mmol/L (ref 98–111)
Creatinine, Ser: 0.91 mg/dL (ref 0.61–1.24)
GFR, Estimated: >60 mL/min (ref >60)
Glucose, Bld: 118 mg/dL — ABNORMAL HIGH (ref 70–99)
Potassium: 4.4 mmol/L (ref 3.5–5.1)
Sodium: 137 mmol/L (ref 135–145)
Total Bilirubin: 0.7 mg/dL (ref 0.3–1.2)
Total Protein: 6.1 g/dL — ABNORMAL LOW (ref 6.5–8.1)

## 2020-08-12 LAB — VITAMIN B12: Vitamin B-12: 933 pg/mL — ABNORMAL HIGH (ref 180–914)

## 2020-08-12 LAB — URINALYSIS, ROUTINE W REFLEX MICROSCOPIC
Bilirubin Urine: NEGATIVE
Glucose, UA: NEGATIVE mg/dL
Hgb urine dipstick: NEGATIVE
Ketones, ur: NEGATIVE mg/dL
Leukocytes,Ua: NEGATIVE
Nitrite: NEGATIVE
Protein, ur: NEGATIVE mg/dL
Specific Gravity, Urine: 1.016 (ref 1.005–1.030)
pH: 5 (ref 5.0–8.0)

## 2020-08-12 LAB — TROPONIN I (HIGH SENSITIVITY)
Troponin I (High Sensitivity): 92 ng/L — ABNORMAL HIGH (ref <18)
Troponin I (High Sensitivity): 76 ng/L — ABNORMAL HIGH (ref <18)

## 2020-08-12 LAB — RETICULOCYTES
Immature Retic Fract: 27.6 % — ABNORMAL HIGH (ref 2.3–15.9)
RBC.: 4.17 MIL/uL — ABNORMAL LOW (ref 4.22–5.81)
Retic Count, Absolute: 113.8 10*3/uL (ref 19.0–186.0)
Retic Ct Pct: 2.7 % (ref 0.4–3.1)

## 2020-08-12 LAB — IRON AND TIBC
Iron: 16 ug/dL — ABNORMAL LOW (ref 45–182)
Saturation Ratios: 3 % — ABNORMAL LOW (ref 17.9–39.5)
TIBC: 490 ug/dL — ABNORMAL HIGH (ref 250–450)
UIBC: 474 ug/dL

## 2020-08-12 LAB — POC OCCULT BLOOD, ED: Fecal Occult Bld: NEGATIVE

## 2020-08-12 LAB — FOLATE: Folate: 42.9 ng/mL (ref >5.9)

## 2020-08-12 LAB — FERRITIN: Ferritin: 2 ng/mL — ABNORMAL LOW (ref 24–336)

## 2020-08-12 LAB — ABO/RH: ABO/RH(D): A POS

## 2020-08-12 LAB — PREPARE RBC (CROSSMATCH)

## 2020-08-12 MED ORDER — DOCUSATE SODIUM 250 MG PO CAPS: 250.0000 mg | ORAL_CAPSULE | Freq: Every day | ORAL | 0 refills | Status: AC

## 2020-08-12 MED ORDER — SODIUM CHLORIDE 0.9 % IV SOLN: 10.0000 mL/h | Freq: Once | INTRAVENOUS | Status: DC

## 2020-08-12 MED ORDER — GABAPENTIN 300 MG PO CAPS
300.0000 mg | ORAL_CAPSULE | Freq: Once | ORAL | Status: AC
  Administered 2020-08-12: 300 mg via ORAL
  Filled 2020-08-12: qty 1

## 2020-08-12 MED ORDER — FERROUS SULFATE 325 (65 FE) MG PO TABS: 325.0000 mg | ORAL_TABLET | Freq: Every day | ORAL | 0 refills | Status: AC

## 2020-08-12 NOTE — ED Provider Notes (Signed)
[facility] EMERGENCY DEPARTMENT Provider Note   CSN: [id number] Arrival date & time: 08/12/20  4782     History Chief Complaint  Patient presents with  . Abnormal Lab    Corey Silva is a 60 y.o. male.  HPI   Patient with significant medical history of CAD, diabetes type 2, hypertension, cirrhosis presents to the emergency department with chief complaint of low hemoglobin.  He endorses that he saw his primary care doctor a few days ago where they obtained lab work, and they noted that his hemoglobin dropped from 15.3 down to 7.9 in a year's time.  Patient endorses that over the last 3 months he has felt more fatigued and more had intermittent chest pain and dizziness.  He endorses that the chest pain is in the center of his chest, does not radiate, is not associated with shortness of breath, becoming diaphoretic, nausea, vomiting, is not exacerbated with movement, has no aggravating factors.  Patient catheterization 1 year ago reveals that he has 1 vessel stenosis, EF preserved at 60-65%.  He also endorses that he has felt more dizzy which occurs when he changes positions, but will go away on its own.  He has no history of anemia, no history of bleeding disorders, denies nosebleeds or abnormal bruising, denies history of GI bleeds, no history of diverticulitis, he denies  dark tarry stools, seeing blood in his urine.  Patient denies alleviating factors.  Patient denies headaches, fevers, chills, shortness of breath, chest pain, abdominal pain, nausea, vomiting, diarrhea, worsening pedal edema.  Past Medical History:  Diagnosis Date  . Cirrhosis (HCC)   . Coronary artery disease   . Diabetes mellitus without complication (HCC)   . Hypertension     Patient Active Problem List   Diagnosis Date Noted  . Angina pectoris (HCC) 08/05/2019  . Hyperlipidemia 08/05/2019  . HTN (hypertension) 08/05/2019  . Type 2 diabetes mellitus with complication, with long-term current use of  insulin (HCC) 08/05/2019    Past Surgical History:  Procedure Laterality Date  . LEFT HEART CATH AND CORONARY ANGIOGRAPHY N/A 08/05/2019   Procedure: LEFT HEART CATH AND CORONARY ANGIOGRAPHY;  Surgeon: Swaziland, Peter M, MD;  Location: [facility] INVASIVE CV LAB;  Service: Cardiovascular;  Laterality: N/A;       No family history on file.  Social History   Tobacco Use  . Smoking status: Never Smoker  . Smokeless tobacco: Never Used  Substance Use Topics  . Alcohol use: Not Currently  . Drug use: Never    Home Medications Prior to Admission medications   Medication Sig Start Date End Date Taking? Authorizing Provider  Apoaequorin (PREVAGEN EXTRA STRENGTH) 20 MG CAPS Take 1 tablet by mouth daily.   Yes [provider]  aspirin EC 81 MG tablet Take 81 mg by mouth at bedtime.   Yes [provider]  busPIRone (BUSPAR) 10 MG tablet Take 20 mg by mouth 2 (two) times daily.    Yes [provider]  carboxymethylcellulose (REFRESH PLUS) 0.5 % SOLN Place 1 drop into both eyes in the morning and at bedtime.   Yes [provider]  clopidogrel (PLAVIX) 75 MG tablet Take 75 mg by mouth every evening.   Yes [provider]  docusate sodium (COLACE) 250 MG capsule Take 1 capsule (250 mg total) by mouth daily. 08/12/20  Yes Carroll Sage, PA-C  DULoxetine (CYMBALTA) 60 MG capsule Take 60 mg by mouth 2 (two) times daily.    Yes [provider]  ferrous sulfate 325 (65 FE) MG tablet Take 1 tablet (325 mg total) by mouth daily. 08/12/20  Yes Carroll Sage, PA-C  gabapentin (NEURONTIN) 300 MG capsule Take 900 mg by mouth 4 (four) times daily.    Yes [provider]  HUMULIN R U-500 KWIKPEN 500 UNIT/ML kwikpen Inject 40-50 Units into the skin 2 (two) times daily with a meal. Inject 40 units in the morning and Inject 50 units in the evening 01/07/20  Yes [provider]  hydrochlorothiazide (HYDRODIURIL) 25 MG tablet Take 25 mg by mouth  every evening.   Yes [provider]  isosorbide mononitrate (IMDUR) 60 MG 24 hr tablet Take 60 mg by mouth at bedtime.   Yes [provider]  losartan (COZAAR) 100 MG tablet Take 100 mg by mouth at bedtime.   Yes [provider]  metFORMIN (GLUCOPHAGE-XR) 500 MG 24 hr tablet Take 2 tablets (1,000 mg total) by mouth in the morning and at bedtime. 08/08/19  Yes Swaz[place], Peter M, MD  metoprolol succinate (TOPROL-XL) 100 MG 24 hr tablet Take 1 tablet (100 mg total) by mouth daily. Take with or immediately following a meal. Patient taking differently: Take 100 mg by mouth every evening. Take with or immediately following a meal. 08/11/20 08/06/21 Yes Little Ishikawa, MD  Multiple Vitamins-Minerals (MULTIVITAMIN WITH MINERALS) tablet Take 1 tablet by mouth daily.   Yes [provider]  Multiple Vitamins-Minerals (PRESERVISION AREDS PO) Take 1 tablet by mouth in the morning and at bedtime.   Yes [provider]  nitroGLYCERIN (NITROSTAT) 0.4 MG SL tablet Place 0.4 mg under the tongue every 5 (five) minutes as needed for chest pain.   Yes [provider]  omeprazole (PRILOSEC OTC) 20 MG tablet Take 20 mg by mouth daily.   Yes [provider]  OZEMPIC, 0.25 OR 0.5 MG/DOSE, 2 MG/1.5ML SOPN Inject 0.25 mg into the skin once a week. 01/07/20  Yes [provider]  pregabalin (LYRICA) 225 MG capsule Take 225 mg by mouth 2 (two) times daily.    Yes [provider]  rosuvastatin (CRESTOR) 40 MG tablet Take 40 mg by mouth every evening.   Yes [provider]  tamsulosin (FLOMAX) 0.4 MG CAPS capsule Take 0.4 mg by mouth at bedtime.   Yes [provider]  Vitamin D, Cholecalciferol, 25 MCG (1000 UT) TABS Take 1,000 mg by mouth daily.    Yes [provider]  DORZOLAMIDE HCL-TIMOLOL MAL OP Place 1 drop into the left eye daily.    [provider]  isosorbide mononitrate (IMDUR) 60 MG 24 hr tablet Take 1  tablet (60 mg total) by mouth daily. 09/17/19 12/16/19  Little Ishikawa, MD  nitroGLYCERIN (NITROSTAT) 0.4 MG SL tablet Place 1 tablet (0.4 mg total) under the tongue every 5 (five) minutes as needed for chest pain. 08/01/19 10/30/19  Little Ishikawa, MD    Allergies    Sm non-asprin sinus [pseudoephedrine-acetaminophen]  Review of Systems   Review of Systems  Constitutional: Positive for fatigue. Negative for chills and fever.  HENT: Negative for congestion.   Respiratory: Negative for shortness of breath.   Cardiovascular: Negative for chest pain.  Gastrointestinal: Negative for abdominal pain, blood in stool, diarrhea, nausea and vomiting.  Genitourinary: Negative for enuresis and hematuria.  Musculoskeletal: Negative for back pain.  Skin: Negative for rash.  Neurological: Positive for dizziness.  Hematological: Does not bruise/bleed easily.    Physical Exam Updated Vital Signs  BP 140/68   Pulse 70   Temp 97.9 F (36.6 C) (Oral)   Resp 20   SpO2 100%   Physical Exam Vitals and nursing note reviewed. Exam conducted with a chaperone present.  Constitutional:      General: He is not in acute distress.    Appearance: He is not ill-appearing.  HENT:     Head: Normocephalic and atraumatic.     Nose: No congestion.  Eyes:     Conjunctiva/sclera: Conjunctivae normal.  Cardiovascular:     Rate and Rhythm: Normal rate and regular rhythm.     Pulses: Normal pulses.     Heart sounds: No murmur heard. No friction rub. No gallop.   Pulmonary:     Effort: No respiratory distress.     Breath sounds: No wheezing, rhonchi or rales.  Abdominal:     Palpations: Abdomen is soft.     Tenderness: There is no abdominal tenderness. There is no right CVA tenderness or rebound.  Genitourinary:    Comments: With chaperone present rectal exam was performed there is no noted external hemorrhoids present, there is no palpable internal hemorrhoids, no gross hematuria present on  exam. Musculoskeletal:     Right lower leg: No edema.     Left lower leg: No edema.  Skin:    General: Skin is warm and dry.  Neurological:     Mental Status: He is alert.  Psychiatric:        Mood and Affect: Mood normal.     ED Results / Procedures / Treatments   Labs (all labs ordered are listed, but only abnormal results are displayed) Labs Reviewed  COMPREHENSIVE METABOLIC PANEL - Abnormal; Notable for the following components:      Result Value   Glucose, Bld 118 (*)    Calcium 8.8 (*)    Total Protein 6.1 (*)    AST 44 (*)    All other components within normal limits  CBC WITH DIFFERENTIAL/PLATELET - Abnormal; Notable for the following components:   RBC 4.13 (*)    Hemoglobin 7.2 (*)    HCT 27.3 (*)    MCV 66.1 (*)    MCH 17.4 (*)    MCHC 26.4 (*)    RDW 19.7 (*)    Platelets 107 (*)    All other components within normal limits  VITAMIN B12 - Abnormal; Notable for the following components:   Vitamin B-12 933 (*)    All other components within normal limits  IRON AND TIBC - Abnormal; Notable for the following components:   Iron 16 (*)    TIBC 490 (*)    Saturation Ratios 3 (*)    All other components within normal limits  FERRITIN - Abnormal; Notable for the following components:   Ferritin 2 (*)    All other components within normal limits  RETICULOCYTES - Abnormal; Notable for the following components:   RBC. 4.17 (*)    Immature Retic Fract 27.6 (*)    All other components within normal limits  TROPONIN I (HIGH SENSITIVITY) - Abnormal; Notable for the following components:   Troponin I (High Sensitivity) 92 (*)    All other components within normal limits  TROPONIN I (HIGH SENSITIVITY) - Abnormal; Notable for the following components:   Troponin I (High Sensitivity) 76 (*)    All other components within normal limits  URINALYSIS, ROUTINE W REFLEX MICROSCOPIC  FOLATE  POC OCCULT BLOOD, ED  PREPARE RBC (CROSSMATCH)  ABO/RH  TYPE AND  SCREEN     EKG EKG Interpretation  Date/Time:  Thursday August 12 2020 11:18:08 EDT Ventricular Rate:  71 PR Interval:  175 QRS Duration: 104 QT Interval:  413 QTC Calculation: 449 R Axis:   -17 Text Interpretation: Sinus rhythm Abnormal R-wave progression, early transition Left ventricular hypertrophy Abnormal ECG Confirmed by Gerhard Munch [phone]) on 08/12/2020 11:44:25 AM   Radiology DG Chest Port 1 View  Result Date: 08/12/2020 CLINICAL DATA:  60 year old male with weakness and unexplained anemia. EXAM: PORTABLE CHEST 1 VIEW COMPARISON:  None. FINDINGS: Portable AP semi upright view at 1022 hours. Normal lung volumes and mediastinal contours. Visualized tracheal air column is within normal limits. Tiny metallic density foreign body projects at the lateral left lung. Elsewhere Allowing for portable technique the lungs are clear. No pneumothorax or pleural effusion. No osseous abnormality identified. IMPRESSION: Negative portable chest; tiny left chest metallic foreign body. Electronically Signed   By: Odessa Fleming M.D.   On: 08/12/2020 10:49    Procedures .Critical Care Performed by: Carroll Sage, PA-C Authorized by: Carroll Sage, PA-C   Critical care provider statement:    Critical care time (minutes):  45   Critical care time was exclusive of:  Separately billable procedures and treating other patients   Critical care was necessary to treat or prevent imminent or life-threatening deterioration of the following conditions:  Circulatory failure   Critical care was time spent personally by me on the following activities:  Discussions with consultants, evaluation of patient's response to treatment, examination of patient, ordering and performing treatments and interventions, ordering and review of laboratory studies, ordering and review of radiographic studies, pulse oximetry, re-evaluation of patient's condition and review of old charts   I assumed direction of critical care for this  patient from another provider in my specialty: no       Medications Ordered in ED Medications  0.9 %  sodium chloride infusion (has no administration in time range)  gabapentin (NEURONTIN) capsule 300 mg (300 mg Oral Given 08/12/20 1456)    ED Course  I have reviewed the triage vital signs and the nursing notes.  Pertinent labs & imaging results that were available during my care of the patient were reviewed by me and considered in my medical decision making (see chart for details).    MDM Rules/Calculators/A&P                         Initial impression-patient presents with low hemoglobin fatigue and dizziness.  He is alert, does not appear in acute distress and vital signs reassuring.  Will obtain Hemoccult, basic lab work, and reassess.  Work-up-CBC shows microcytic anemia with hemoglobin of 7.2, decreased from 7.92 days ago.  CMP shows hyperglycemia 118, slightly elevated AST 44.  Initial troponin is 92, reticular count shows decrease red blood cells 4.17, Hemoccult negative, chest x-ray negative for acute findings.  EKG sinus rhythm not signs of ischemia  Reassessment patient is reassessed while receiving blood, endorses that he has some generalized pain states that has not had his gabapentin yet.  We will provide him with his home dose of gabapentin and continue to monitor.  Patient reassessed after providing him with gabapentin, states he is feeling much better, has no complaints this time.  Vital signs have remained stable.  Patient is agreeable for discharge at this time.  Rule out-low suspicion for lower GI bleed as abdomen is soft nontender to palpation, no peritoneal sign  present, Hemoccult negative.  Low suspicion for upper GI bleeding as patient denies hematemesis, no right upper quadrant pain or epigastric pain, no peritoneal signs on my exam.  Low suspicion for diverticulitis as patient has no left lower quadrant pain, has no history of this, no leukocytosis seen on CBC.  Low  suspicion for leukemia as he does not have pancytopenia, no history of malignancy.  Low suspicion for ACS as history is atypical, EKG is without signs of ischemia troponin is downtrending.  Patient is noted to have elevated troponins 92 down to 76 I suspect this may be secondary due to demand ischemia from anemia.  Low suspicion for aortic dissection or AAA as patient has low risk factors, history is atypical.  Plan-patient has microcytic anemia which I suspect is secondary to iron deficiency as his ferritin is low and his reticulocyte count elevated.  Will start him on supplemental iron, stool softeners, follow-up with PCP for further evaluation.  Vital signs have remained stable, no indication for hospital admission.  Patient discussed with attending and they agreed with assessment and plan.  Patient given at home care as well strict return precautions.  Patient verbalized that they understood agreed to said plan.   Final Clinical Impression(s) / ED Diagnoses Final diagnoses:  Symptomatic anemia    Rx / DC Orders ED Discharge Orders         Ordered    ferrous sulfate 325 (65 FE) MG tablet  Daily        08/12/20 1647    docusate sodium (COLACE) 250 MG capsule  Daily        08/12/20 1647           Carroll SageFaulkner, Leonna Schlee J, PA-C 08/12/20 1916    Gerhard MunchLockwood, Robert, MD 08/13/20 2344

## 2020-08-12 NOTE — ED Triage Notes (Signed)
Pt sent by PCP for eval of drop in Hgb to 7.9. Pt endorses generalized weakness over the last two weeks but denies any known source of bleeding.

## 2020-08-12 NOTE — Discharge Instructions (Signed)
You have symptomatic anemia, I have started you on iron, please take as prescribed.  Please beware iron can make your stools darker as well as make you constipated, have started you on Colace please take as prescribed.  It is important a follow-up with your PCP in 1 week's time for reevaluation as I feel you need further follow-up with your anemia.  Come back to the emergency department if you develop chest pain, shortness of breath, severe abdominal pain, uncontrolled nausea, vomiting, diarrhea.

## 2020-08-12 NOTE — ED Provider Notes (Signed)
  Provider Note MRN:  [id number]  Arrival date & time: 08/12/20    ED Course and Medical Decision Making  Assumed care from Dr. Jeraldine Loots at shift change.  Iron deficiency anemia, received blood transfusion here in the emergency department.  Had some elevated troponin which is downtrending, likely related to demand ischemia.  Patient had minimal chest pain 1 or 2 weeks ago but none since, currently without any chest pain or symptoms, not lightheaded, not short of breath.  Feels well, feels much better after transfusion, would like to go home.  Admission offered but still patient prefers close outpatient follow-up.  Appropriate for discharge.  Procedures  Final Clinical Impressions(s) / ED Diagnoses     ICD-10-CM   1. Symptomatic anemia  D64.9     ED Discharge Orders         Ordered    ferrous sulfate 325 (65 FE) MG tablet  Daily        08/12/20 1647    docusate sodium (COLACE) 250 MG capsule  Daily        08/12/20 1647            Discharge Instructions     You have symptomatic anemia, I have started you on iron, please take as prescribed.  Please beware iron can make your stools darker as well as make you constipated, have started you on Colace please take as prescribed.  It is important a follow-up with your PCP in 1 week's time for reevaluation as I feel you need further follow-up with your anemia.  Come back to the emergency department if you develop chest pain, shortness of breath, severe abdominal pain, uncontrolled nausea, vomiting, diarrhea.     Elmer Sow. Pilar Plate, MD [facility] Emergency Medicine [facility] [email or web address]    Sabas Sous, MD 08/12/20 [phone]

## 2020-08-13 ENCOUNTER — Telehealth: Payer: Self-pay | Admitting: *Deleted

## 2020-08-13 DIAGNOSIS — E113313 Type 2 diabetes mellitus with moderate nonproliferative diabetic retinopathy with macular edema, bilateral: Secondary | ICD-10-CM | POA: Diagnosis not present

## 2020-08-13 LAB — TYPE AND SCREEN
ABO/RH(D): A POS
Antibody Screen: NEGATIVE
Unit division: 0

## 2020-08-13 LAB — BPAM RBC
Blood Product Expiration Date: 202205042359
ISSUE DATE / TIME: 202204071427
Unit Type and Rh: 6200

## 2020-08-13 NOTE — Telephone Encounter (Signed)
Per Dr. Bjorn Pippin: patient needs to hold Plavix, continue ASA and follow up with GI.  Called patient to discuss: Patient is feeling better today, not 100% but improved.   He is holding the Plavix, taking the aspirin.  He has GI doctor through the [place].  Advised to call to follow up with them asap.    Patient aware and verbalized understanding.

## 2020-08-17 DIAGNOSIS — J439 Emphysema, unspecified: Secondary | ICD-10-CM | POA: Diagnosis not present

## 2020-08-17 DIAGNOSIS — K7460 Unspecified cirrhosis of liver: Secondary | ICD-10-CM | POA: Diagnosis not present

## 2020-08-17 DIAGNOSIS — K766 Portal hypertension: Secondary | ICD-10-CM | POA: Diagnosis not present

## 2020-08-17 DIAGNOSIS — R918 Other nonspecific abnormal finding of lung field: Secondary | ICD-10-CM | POA: Diagnosis not present

## 2020-09-16 DIAGNOSIS — E113512 Type 2 diabetes mellitus with proliferative diabetic retinopathy with macular edema, left eye: Secondary | ICD-10-CM | POA: Diagnosis not present

## 2020-09-16 DIAGNOSIS — H35373 Puckering of macula, bilateral: Secondary | ICD-10-CM | POA: Diagnosis not present

## 2020-09-16 DIAGNOSIS — G629 Polyneuropathy, unspecified: Secondary | ICD-10-CM | POA: Diagnosis not present

## 2020-09-16 DIAGNOSIS — E113491 Type 2 diabetes mellitus with severe nonproliferative diabetic retinopathy without macular edema, right eye: Secondary | ICD-10-CM | POA: Diagnosis not present

## 2020-09-28 DIAGNOSIS — K769 Liver disease, unspecified: Secondary | ICD-10-CM | POA: Diagnosis not present

## 2020-09-28 DIAGNOSIS — E113299 Type 2 diabetes mellitus with mild nonproliferative diabetic retinopathy without macular edema, unspecified eye: Secondary | ICD-10-CM | POA: Diagnosis not present

## 2020-09-28 DIAGNOSIS — Z794 Long term (current) use of insulin: Secondary | ICD-10-CM | POA: Diagnosis not present

## 2020-09-28 DIAGNOSIS — Z7984 Long term (current) use of oral hypoglycemic drugs: Secondary | ICD-10-CM | POA: Diagnosis not present

## 2020-09-28 DIAGNOSIS — E1140 Type 2 diabetes mellitus with diabetic neuropathy, unspecified: Secondary | ICD-10-CM | POA: Diagnosis not present

## 2020-10-06 DIAGNOSIS — I8500 Esophageal varices without bleeding: Secondary | ICD-10-CM | POA: Diagnosis not present

## 2020-10-06 DIAGNOSIS — K7460 Unspecified cirrhosis of liver: Secondary | ICD-10-CM | POA: Diagnosis not present

## 2020-10-06 DIAGNOSIS — D649 Anemia, unspecified: Secondary | ICD-10-CM | POA: Diagnosis not present

## 2020-10-06 DIAGNOSIS — R1310 Dysphagia, unspecified: Secondary | ICD-10-CM | POA: Diagnosis not present

## 2020-10-22 DIAGNOSIS — R6882 Decreased libido: Secondary | ICD-10-CM | POA: Diagnosis not present

## 2020-10-22 DIAGNOSIS — N529 Male erectile dysfunction, unspecified: Secondary | ICD-10-CM | POA: Diagnosis not present

## 2020-10-22 DIAGNOSIS — N401 Enlarged prostate with lower urinary tract symptoms: Secondary | ICD-10-CM | POA: Diagnosis not present

## 2020-10-22 DIAGNOSIS — R3911 Hesitancy of micturition: Secondary | ICD-10-CM | POA: Diagnosis not present

## 2020-10-22 DIAGNOSIS — N3943 Post-void dribbling: Secondary | ICD-10-CM | POA: Diagnosis not present

## 2020-10-22 DIAGNOSIS — R339 Retention of urine, unspecified: Secondary | ICD-10-CM | POA: Diagnosis not present

## 2020-10-26 DIAGNOSIS — D509 Iron deficiency anemia, unspecified: Secondary | ICD-10-CM | POA: Diagnosis not present

## 2020-10-26 DIAGNOSIS — K7460 Unspecified cirrhosis of liver: Secondary | ICD-10-CM | POA: Diagnosis not present

## 2020-10-26 DIAGNOSIS — K5520 Angiodysplasia of colon without hemorrhage: Secondary | ICD-10-CM | POA: Diagnosis not present

## 2020-10-26 DIAGNOSIS — Z20822 Contact with and (suspected) exposure to covid-19: Secondary | ICD-10-CM | POA: Diagnosis not present

## 2020-10-26 DIAGNOSIS — R1310 Dysphagia, unspecified: Secondary | ICD-10-CM | POA: Diagnosis not present

## 2020-10-26 DIAGNOSIS — I8510 Secondary esophageal varices without bleeding: Secondary | ICD-10-CM | POA: Diagnosis not present

## 2020-11-02 DIAGNOSIS — E119 Type 2 diabetes mellitus without complications: Secondary | ICD-10-CM | POA: Diagnosis not present

## 2020-11-02 DIAGNOSIS — Z794 Long term (current) use of insulin: Secondary | ICD-10-CM | POA: Diagnosis not present

## 2020-11-02 DIAGNOSIS — Z7984 Long term (current) use of oral hypoglycemic drugs: Secondary | ICD-10-CM | POA: Diagnosis not present

## 2020-11-02 DIAGNOSIS — Z79899 Other long term (current) drug therapy: Secondary | ICD-10-CM | POA: Diagnosis not present

## 2020-11-09 DIAGNOSIS — E113512 Type 2 diabetes mellitus with proliferative diabetic retinopathy with macular edema, left eye: Secondary | ICD-10-CM | POA: Diagnosis not present

## 2020-11-09 DIAGNOSIS — E1140 Type 2 diabetes mellitus with diabetic neuropathy, unspecified: Secondary | ICD-10-CM | POA: Diagnosis not present

## 2020-11-09 DIAGNOSIS — H2511 Age-related nuclear cataract, right eye: Secondary | ICD-10-CM | POA: Diagnosis not present

## 2020-11-09 DIAGNOSIS — E113411 Type 2 diabetes mellitus with severe nonproliferative diabetic retinopathy with macular edema, right eye: Secondary | ICD-10-CM | POA: Diagnosis not present

## 2020-11-21 NOTE — Progress Notes (Deleted)
Cardiology Office Note:    Date:  11/21/2020   ID:  Corey Silva, DOB Sep 04, 1960, MRN [id number]  PCP:  Erick Alley, MD  Cardiologist:  Little Ishikawa, MD  Electrophysiologist:  None   Referring MD: Erick Alley, MD   No chief complaint on file.   History of Present Illness:    Corey Silva is a 59 y.o. male with a hx of CAD, cirrhosis, type 2 diabetes, hypertension,hyperlipidemia who presents for follow-up.  He was referred by Dr. Nelva Nay for evaluation of chest pain, initially seen on 07/14/2019.  Cath 11/2013 at [facility] in [place] showed 60% in-stent restenosis in proximal LAD, 20% in-stent restenosis in D1, 90% stenosis in distal RCA.  S/p DES to distal RCA.  Lexiscan Myoview on 07/23/2019 showed basal to mid anterior ischemia.  Cath on 08/05/2019 showed proximal RCA 30%, mid RCA 30%, RPL 40%, patent distal RCA stent, patent RPAV stent, RPAV 60%, mid LAD 50%, 100% D1 fills by left to left collaterals, 95% ostial D2 not amenable to PCI, normal LV systolic function.  ABIs 09/01/2019 showed noncompressible vessels with normal TBI's.  At clinic appointment for 08/11/20, found to have iron deficiency anemia (hemoglobin decreased from 15.3 on 08/01/19 to 7.9 on 08/11/2020, MCV 64, ferritin 2).  He was sent to the ED and received blood transfusion.  He declined admission.  Recommended to hold Plavix, continue aspirin given his prior coronary stenting, and referred to GI.  Since last clinic visit,  he reports that he has been doing okay.  Does report a couple episodes of chest pain.  Describes as pain in upper left-side of chest, described as twisting pain that occurs when he starts walking.  Does not happen reliably when he walks.  Reports episode lasted about a minute resolved with rest.  He has not been walking much due to his pain in his feet.  Also reports dyspnea during episodes.  Does have intermittent lightheadedness when he stands, denies any syncope.  Reports has been having palpitations that he  described as feeling like his heart is racing.  Can occur up to 2-3 times per week.  Typically last for about 1 minute or so.  Denies any lower extremity edema.  Continues to take DAPT, reports frequent bruising.  Denies any blood in stool.   Past Medical History:  Diagnosis Date   Cirrhosis (HCC)    Coronary artery disease    Diabetes mellitus without complication (HCC)    Hypertension       Current Medications: No outpatient medications have been marked as taking for the 11/24/20 encounter (Appointment) with Little Ishikawa, MD.     Allergies:   Sm non-asprin sinus [pseudoephedrine-acetaminophen]   Social History   Socioeconomic History   Marital status: Married    Spouse name: Not on file   Number of children: Not on file   Years of education: Not on file   Highest education level: Not on file  Occupational History   Not on file  Tobacco Use   Smoking status: Never   Smokeless tobacco: Never  Substance and Sexual Activity   Alcohol use: Not Currently   Drug use: Never   Sexual activity: Yes  Other Topics Concern   Not on file  Social History Narrative   Not on file   Social Determinants of Health   Financial Resource Strain: Not on file  Food Insecurity: Not on file  Transportation Needs: Not on file  Physical Activity: Not on file  Stress: Not on file  Social Connections: Not on file     Family History: No relevant family history ROS:   Please see the history of present illness.     All other systems reviewed and are negative.  EKGs/Labs/Other Studies Reviewed:    The following studies were reviewed today:  EKG:  EKG is ordered today.  The ekg ordered today demonstrates normal sinus rhythm,  rate 74, no ST abnormalities  Lexiscan Myoview 07/23/2019: The left ventricular ejection fraction is normal (55-65%). Nuclear stress EF: 55%. There was no ST segment deviation noted during stress. No T wave inversion was noted during stress. Findings  consistent with ischemia. This is an intermediate risk study.   Impression:   1. Diaphragm attenuation artifact.  2. There is a medium size (6-8%), moderate perfusion defect present in the basal to mid anterior wall consistent with ischemia.  3. No evidence of prior infarction.  4. Normal LVEF, 55%.  5. This is an intermediate risk study given the patient's history of CAD/PCI.   Cath 08/04/29: Previously placed Dist RCA stent (unknown type) is widely patent. Prox RCA lesion is 30% stenosed. Mid RCA-1 lesion is 30% stenosed. Mid RCA-2 lesion is 35% stenosed. 1st RPL lesion is 40% stenosed. Previously placed RPAV-1 stent (unknown type) is widely patent. RPAV-2 lesion is 60% stenosed. Mid LAD lesion is 50% stenosed. 1st Diag lesion is 100% stenosed. 2nd Diag lesion is 95% stenosed. The left ventricular systolic function is normal. LV end diastolic pressure is normal. The left ventricular ejection fraction is 55-65% by visual estimate.   1. Single vessel obstructive CAD    - Stents in proximal to mid LAD are patent with focal area of 50% narrowing.    - 100% first diagonal. Fills by left to left collaterals.    - 95% ostial small second diagonal    - stent in distal RCA is patent. 2. Normal LV function 3. Normal LVEDP   Plan: compared to last cath report from [place] in 2017 there is no significant change. Recommend continued medical therapy. Area of ischemia noted on stress testing corresponds to diagonal disease that is not suitable for PCI.     ABIs 09/01/19: Right: Resting right ankle-brachial index indicates noncompressible right  lower extremity arteries. The right toe-brachial index is normal.   Left: Resting left ankle-brachial index indicates noncompressible left  lower extremity arteries. The left toe-brachial index is normal.   Recent Labs: 08/12/2020: ALT 38; BUN 14; Creatinine, Ser 0.91; Hemoglobin 7.2; Platelets 107; Potassium 4.4; Sodium 137  Recent Lipid Panel     Component Value Date/Time   CHOL 97 (L) 08/11/2020 0904   TRIG 237 (H) 08/11/2020 0904   HDL 35 (L) 08/11/2020 0904   CHOLHDL 2.8 08/11/2020 0904   LDLCALC 26 08/11/2020 0904    Physical Exam:    VS:  There were no vitals taken for this visit.    Wt Readings from Last 3 Encounters:  08/11/20 253 lb 6.4 oz (114.9 kg)  02/11/20 261 lb (118.4 kg)  09/17/19 261 lb 6.4 oz (118.6 kg)     GEN:  Well nourished, well developed in no acute distress HEENT: Normal NECK: No JVD CARDIAC: RRR, 2/6 systolic murmur RESPIRATORY:  Clear to auscultation without rales, wheezing or rhonchi  ABDOMEN: Soft, non-tender, non-distended MUSCULOSKELETAL:  No edema; No deformity  SKIN: Warm and dry NEUROLOGIC:  Alert and oriented x 3 PSYCHIATRIC:  Normal affect   ASSESSMENT:    No diagnosis found.  PLAN:     CAD: Status post mid LAD and distal RCA stenting.  Lexiscan Myoview showed anterior ischemia.  Cath on 08/05/2019 showed patent stents, occluded D1, 95% D2 not amenable to intervention.  Medical management recommended.  Currently with intermittent chest pain -Continue ASA 81 mg, rosuvastatin 20 mg daily.  Plavix discontinued given iron deficiency anemia -Continue metoprolol, will consolidate to Toprol-XL 100 mg daily -Continue Imdur 60 mg daily -SL NTG prn  Iron deficiency anemia: At clinic appointment for 08/11/20, found to have iron deficiency anemia (hemoglobin decreased from 15.3 on 08/01/19 to 7.9 on 08/11/2020, MCV 64, ferritin 2).  He was sent to the ED and received blood transfusion.  He declined admission.  Recommended to hold Plavix, continue aspirin given his prior coronary stenting, and referred to GI.  Palpitations: Description concerning for arrhythmia, will evaluate with Zio patch x2 weeks  Hypertension: appears controlled, continue hydrochlorothiazide 25 mg daily, Imdur 60 mg daily, metoprolol 100 mg daily, and losartan 100 mg  Type 2 diabetes: On insulin.  A1c 6.9 on  06/04/2019  Hyperlipidemia: On rosuvastatin 20 mg daily.  LDL 44 on 05/29/2019.  Will check lipid panel  Leg pain: ABIs show noncompressible vessels but normal TBI's.   RTC in ***  Medication Adjustments/Labs and Tests Ordered: Current medicines are reviewed at length with the patient today.  Concerns regarding medicines are outlined above.  No orders of the defined types were placed in this encounter.  No orders of the defined types were placed in this encounter.   There are no Patient Instructions on file for this visit.   Signed, Little Ishikawa, MD  11/21/2020 2:29 PM    [facility]

## 2020-11-22 NOTE — Progress Notes (Signed)
Cardiology Office Note:    Date:  11/24/2020   ID:  [name], DOB May 16, 1960, MRN [id number]  PCP:  Erick Alley, MD  Cardiologist:  Little Ishikawa, MD  Electrophysiologist:  None   Referring MD: Erick Alley, MD   Chief Complaint  Patient presents with   Follow-up    3 months.   Shortness of Breath     History of Present Illness:    Corey Silva is a 60 y.o. male with a hx of CAD, cirrhosis, type 2 diabetes, hypertension,hyperlipidemia who presents for follow-up.  He was referred by Dr. Nelva Nay for evaluation of chest pain, initially seen on 07/14/2019.  Cath 11/2013 at [facility] in [place] showed 60% in-stent restenosis in proximal LAD, 20% in-stent restenosis in D1, 90% stenosis in distal RCA.  S/p DES to distal RCA.  Lexiscan Myoview on 07/23/2019 showed basal to mid anterior ischemia.  Cath on 08/05/2019 showed proximal RCA 30%, mid RCA 30%, RPL 40%, patent distal RCA stent, patent RPAV stent, RPAV 60%, mid LAD 50%, 100% D1 fills by left to left collaterals, 95% ostial D2 not amenable to PCI, normal LV systolic function.  ABIs 09/01/2019 showed noncompressible vessels with normal TBI's.  At clinic appointment for 08/11/20, found to have iron deficiency anemia (hemoglobin decreased from 15.3 on 08/01/19 to 7.9 on 08/11/2020, MCV 64, ferritin 2).  He was sent to the ED and received blood transfusion.  He declined admission.  Recommended to hold Plavix, continue aspirin given his prior coronary stenting, and referred to GI.  Since last clinic visit, patient is reasonably doing well. He occasionally has brusing, shortness of breath and chest pains with no exertion. He includes that his chest pains are a "twisting" feeling that happens once or twice a day. He says his toes feel tight and believes its from his neuropathy. Denies blood in stool, palpitations, lightheadedness or LE edema.Marland Kitchen He discontinued plavix and is only taking aspirin. He tries to walk but claims his neuropathy effects him.       Past Medical History:  Diagnosis Date   Cirrhosis (HCC)    Coronary artery disease    Diabetes mellitus without complication (HCC)    Hypertension       Current Medications: Current Meds  Medication Sig   Apoaequorin (PREVAGEN EXTRA STRENGTH) 20 MG CAPS Take 1 tablet by mouth daily.   aspirin EC 81 MG tablet Take 81 mg by mouth at bedtime.   busPIRone (BUSPAR) 10 MG tablet Take 20 mg by mouth 2 (two) times daily.    carboxymethylcellulose (REFRESH PLUS) 0.5 % SOLN Place 1 drop into both eyes in the morning and at bedtime.   docusate sodium (COLACE) 250 MG capsule Take 1 capsule (250 mg total) by mouth daily.   DORZOLAMIDE HCL-TIMOLOL MAL OP Place 1 drop into the left eye daily.   DULoxetine (CYMBALTA) 60 MG capsule Take 60 mg by mouth 2 (two) times daily.    ferrous sulfate 325 (65 FE) MG tablet Take 1 tablet (325 mg total) by mouth daily.   gabapentin (NEURONTIN) 300 MG capsule Take 900 mg by mouth 4 (four) times daily.    HUMULIN R U-500 KWIKPEN 500 UNIT/ML kwikpen Inject 40-50 Units into the skin 2 (two) times daily with a meal. Inject 40 units in the morning and Inject 50 units in the evening   hydrochlorothiazide (HYDRODIURIL) 25 MG tablet Take 25 mg by mouth every evening.   isosorbide mononitrate (IMDUR) 60 MG 24 hr tablet Take  60 mg by mouth at bedtime.   losartan (COZAAR) 100 MG tablet Take 100 mg by mouth at bedtime.   metFORMIN (GLUCOPHAGE-XR) 500 MG 24 hr tablet Take 2 tablets (1,000 mg total) by mouth in the morning and at bedtime.   metoprolol succinate (TOPROL-XL) 100 MG 24 hr tablet Take 1 tablet (100 mg total) by mouth daily. Take with or immediately following a meal. (Patient taking differently: Take 100 mg by mouth every evening. Take with or immediately following a meal.)   Multiple Vitamins-Minerals (MULTIVITAMIN WITH MINERALS) tablet Take 1 tablet by mouth daily.   Multiple Vitamins-Minerals (PRESERVISION AREDS PO) Take 1 tablet by mouth in the morning and at  bedtime.   nitroGLYCERIN (NITROSTAT) 0.4 MG SL tablet Place 0.4 mg under the tongue every 5 (five) minutes as needed for chest pain.   omeprazole (PRILOSEC OTC) 20 MG tablet Take 20 mg by mouth daily.   OZEMPIC, 0.25 OR 0.5 MG/DOSE, 2 MG/1.5ML SOPN Inject 0.25 mg into the skin once a week.   pregabalin (LYRICA) 225 MG capsule Take 225 mg by mouth 2 (two) times daily.    rosuvastatin (CRESTOR) 40 MG tablet Take 40 mg by mouth every evening.   tamsulosin (FLOMAX) 0.4 MG CAPS capsule Take 0.4 mg by mouth at bedtime.   Vitamin D, Cholecalciferol, 25 MCG (1000 UT) TABS Take 1,000 mg by mouth daily.    [DISCONTINUED] clopidogrel (PLAVIX) 75 MG tablet Take 75 mg by mouth every evening.     Allergies:   Sm non-asprin sinus [pseudoephedrine-acetaminophen]   Social History   Socioeconomic History   Marital status: Married    Spouse name: Not on file   Number of children: Not on file   Years of education: Not on file   Highest education level: Not on file  Occupational History   Not on file  Tobacco Use   Smoking status: Never   Smokeless tobacco: Never  Substance and Sexual Activity   Alcohol use: Not Currently   Drug use: Never   Sexual activity: Yes  Other Topics Concern   Not on file  Social History Narrative   Not on file   Social Determinants of Health   Financial Resource Strain: Not on file  Food Insecurity: Not on file  Transportation Needs: Not on file  Physical Activity: Not on file  Stress: Not on file  Social Connections: Not on file     Family History: No relevant family history  ROS:   Please see the history of present illness.    All other systems reviewed and are negative.  EKGs/Labs/Other Studies Reviewed:    The following studies were reviewed today: Lexiscan Myoview 07/23/2019: The left ventricular ejection fraction is normal (55-65%). Nuclear stress EF: 55%. There was no ST segment deviation noted during stress. No T wave inversion was noted during  stress. Findings consistent with ischemia. This is an intermediate risk study.   Impression:   1. Diaphragm attenuation artifact.  2. There is a medium size (6-8%), moderate perfusion defect present in the basal to mid anterior wall consistent with ischemia.  3. No evidence of prior infarction.  4. Normal LVEF, 55%.  5. This is an intermediate risk study given the patient's history of CAD/PCI.   Cath 08/04/29: Previously placed Dist RCA stent (unknown type) is widely patent. Prox RCA lesion is 30% stenosed. Mid RCA-1 lesion is 30% stenosed. Mid RCA-2 lesion is 35% stenosed. 1st RPL lesion is 40% stenosed. Previously placed RPAV-1 stent (unknown type)  is widely patent. RPAV-2 lesion is 60% stenosed. Mid LAD lesion is 50% stenosed. 1st Diag lesion is 100% stenosed. 2nd Diag lesion is 95% stenosed. The left ventricular systolic function is normal. LV end diastolic pressure is normal. The left ventricular ejection fraction is 55-65% by visual estimate.   1. Single vessel obstructive CAD    - Stents in proximal to mid LAD are patent with focal area of 50% narrowing.    - 100% first diagonal. Fills by left to left collaterals.    - 95% ostial small second diagonal    - stent in distal RCA is patent. 2. Normal LV function 3. Normal LVEDP   Plan: compared to last cath report from [place] in 2017 there is no significant change. Recommend continued medical therapy. Area of ischemia noted on stress testing corresponds to diagonal disease that is not suitable for PCI.     ABIs 09/01/19: Right: Resting right ankle-brachial index indicates noncompressible right  lower extremity arteries. The right toe-brachial index is normal.   Left: Resting left ankle-brachial index indicates noncompressible left  lower extremity arteries. The left toe-brachial index is normal.    EKG:   11/24/2020: no EKG is ordered today. 08/11/20: normal sinus rhythm,  rate 74, no ST abnormalities    Recent  Labs: 08/12/2020: ALT 38; BUN 14; Creatinine, Ser 0.91; Hemoglobin 7.2; Platelets 107; Potassium 4.4; Sodium 137  Recent Lipid Panel    Component Value Date/Time   CHOL 97 (L) 08/11/2020 0904   TRIG 237 (H) 08/11/2020 0904   HDL 35 (L) 08/11/2020 0904   CHOLHDL 2.8 08/11/2020 0904   LDLCALC 26 08/11/2020 0904    Physical Exam:    VS:  BP 126/62 (BP Location: Left Arm, Patient Position: Sitting, Cuff Size: Normal)   Pulse 78   Ht 6\' 1"  (1.854 m)   Wt 251 lb (113.9 kg)   BMI 33.12 kg/m     Wt Readings from Last 3 Encounters:  11/24/20 251 lb (113.9 kg)  08/11/20 253 lb 6.4 oz (114.9 kg)  02/11/20 261 lb (118.4 kg)     GEN:  Well nourished, well developed in no acute distress HEENT: Normal NECK: No JVD CARDIAC: RRR, 2/6 systolic murmur RESPIRATORY:  Clear to auscultation without rales, wheezing or rhonchi  ABDOMEN: Soft, non-tender, non-distended MUSCULOSKELETAL:  No edema; No deformity  SKIN: Warm and dry NEUROLOGIC:  Alert and oriented x 3 PSYCHIATRIC:  Normal affect   ASSESSMENT:    1. Coronary artery disease involving native coronary artery of native heart with angina pectoris (HCC)   2. Iron deficiency anemia, unspecified iron deficiency anemia type   3. Palpitations   4. Essential hypertension     PLAN:     CAD: Status post mid LAD and distal RCA stenting.  Lexiscan Myoview showed anterior ischemia.  Cath on 08/05/2019 showed patent stents, occluded D1, 95% D2 not amenable to intervention.  Medical management recommended.  Currently with intermittent chest pain -Continue ASA 81 mg, rosuvastatin 20 mg daily.  Plavix discontinued given iron deficiency anemia -Continue metoprolol, will consolidate to Toprol-XL 100 mg daily -Continue Imdur 60 mg daily -SL NTG prn  Iron deficiency anemia: At clinic appointment for 08/11/20, found to have iron deficiency anemia (hemoglobin decreased from 15.3 on 08/01/19 to 7.9 on 08/11/2020, MCV 64, ferritin 2).  He was sent to the ED and  received blood transfusion.  He declined admission.  Recommended to hold Plavix, continue aspirin given his prior coronary stenting, and referred to  GI.  Palpitations: Description concerning for arrhythmia, will evaluate with Zio patch x2 weeks  Hypertension: appears controlled, continue hydrochlorothiazide 25 mg daily, Imdur 60 mg daily, metoprolol 100 mg daily, and losartan 100 mg  Type 2 diabetes: On insulin.  A1c 6.9 on 06/04/2019  Hyperlipidemia: On rosuvastatin 20 mg daily.  LDL 26 on 08/11/20  Leg pain: ABIs show noncompressible vessels but normal TBI's.   RTC in 6 months.  Medication Adjustments/Labs and Tests Ordered: Current medicines are reviewed at length with the patient today.  Concerns regarding medicines are outlined above.  Orders Placed This Encounter  Procedures   CBC   Fe+TIBC+Fer    No orders of the defined types were placed in this encounter.   Patient Instructions  Medication Instructions:  Your physician recommends that you continue on your current medications as directed. Please refer to the Current Medication list given to you today.  *If you need a refill on your cardiac medications before your next appointment, please call your pharmacy*   Lab Work: CBC, iron studies today  If you have labs (blood work) drawn today and your tests are completely normal, you will receive your results only by: MyChart Message (if you have MyChart) OR A paper copy in the mail If you have any lab test that is abnormal or we need to change your treatment, we will call you to review the results.  Follow-Up: At [facility], you and your health needs are our priority.  As part of our continuing mission to provide you with exceptional heart care, we have created designated Provider Care Teams.  These Care Teams include your primary Cardiologist (physician) and Advanced Practice Providers (APPs -  Physician Assistants and Nurse Practitioners) who all work together to provide  you with the care you need, when you need it.  We recommend signing up for the patient portal called "MyChart".  Sign up information is provided on this After Visit Summary.  MyChart is used to connect with patients for Virtual Visits (Telemedicine).  Patients are able to view lab/test results, encounter notes, upcoming appointments, etc.  Non-urgent messages can be sent to your provider as well.   To learn more about what you can do with MyChart, go to [email or web address].    Your next appointment:   6 month(s)  The format for your next appointment:   In Person  Provider:   Epifanio Lesches, MD       Liz Beach as a scribe for Little Ishikawa, MD.,have documented all relevant documentation on the behalf of Little Ishikawa, MD,as directed by  Little Ishikawa, MD while in the presence of Little Ishikawa, MD.  I, Little Ishikawa, MD, have reviewed all documentation for this visit. The documentation on 11/24/20 for the exam, diagnosis, procedures, and orders are all accurate and complete.  Signed, Little Ishikawa, MD  11/24/2020 9:22 AM    [facility]

## 2020-11-24 ENCOUNTER — Ambulatory Visit (INDEPENDENT_AMBULATORY_CARE_PROVIDER_SITE_OTHER): Payer: No Typology Code available for payment source | Admitting: Cardiology

## 2020-11-24 ENCOUNTER — Other Ambulatory Visit: Payer: Self-pay

## 2020-11-24 ENCOUNTER — Encounter: Payer: Self-pay | Admitting: Cardiology

## 2020-11-24 VITALS — BP 126/62 | HR 78 | Ht 73.0 in | Wt 251.0 lb

## 2020-11-24 DIAGNOSIS — R002 Palpitations: Secondary | ICD-10-CM

## 2020-11-24 DIAGNOSIS — D509 Iron deficiency anemia, unspecified: Secondary | ICD-10-CM | POA: Diagnosis not present

## 2020-11-24 DIAGNOSIS — I10 Essential (primary) hypertension: Secondary | ICD-10-CM | POA: Diagnosis not present

## 2020-11-24 DIAGNOSIS — I25119 Atherosclerotic heart disease of native coronary artery with unspecified angina pectoris: Secondary | ICD-10-CM | POA: Diagnosis not present

## 2020-11-24 NOTE — Patient Instructions (Signed)
Medication Instructions:  Your physician recommends that you continue on your current medications as directed. Please refer to the Current Medication list given to you today.  *If you need a refill on your cardiac medications before your next appointment, please call your pharmacy*   Lab Work: CBC, iron studies today  If you have labs (blood work) drawn today and your tests are completely normal, you will receive your results only by: MyChart Message (if you have MyChart) OR A paper copy in the mail If you have any lab test that is abnormal or we need to change your treatment, we will call you to review the results.  Follow-Up: At [facility], you and your health needs are our priority.  As part of our continuing mission to provide you with exceptional heart care, we have created designated Provider Care Teams.  These Care Teams include your primary Cardiologist (physician) and Advanced Practice Providers (APPs -  Physician Assistants and Nurse Practitioners) who all work together to provide you with the care you need, when you need it.  We recommend signing up for the patient portal called "MyChart".  Sign up information is provided on this After Visit Summary.  MyChart is used to connect with patients for Virtual Visits (Telemedicine).  Patients are able to view lab/test results, encounter notes, upcoming appointments, etc.  Non-urgent messages can be sent to your provider as well.   To learn more about what you can do with MyChart, go to [email or web address].    Your next appointment:   6 month(s)  The format for your next appointment:   In Person  Provider:   Epifanio Lesches, MD

## 2020-11-25 LAB — IRON,TIBC AND FERRITIN PANEL
Ferritin: 18 ng/mL — ABNORMAL LOW (ref 30–400)
Iron Saturation: 36 % (ref 15–55)
Iron: 156 ug/dL (ref 38–169)
Total Iron Binding Capacity: 439 ug/dL (ref 250–450)
UIBC: 283 ug/dL (ref 111–343)

## 2020-11-25 LAB — CBC
Hematocrit: 43.3 % (ref 37.5–51.0)
Hemoglobin: 14 g/dL (ref 13.0–17.7)
MCH: 27.1 pg (ref 26.6–33.0)
MCHC: 32.3 g/dL (ref 31.5–35.7)
MCV: 84 fL (ref 79–97)
Platelets: 114 10*3/uL — ABNORMAL LOW (ref 150–450)
RBC: 5.17 x10E6/uL (ref 4.14–5.80)
RDW: 15.6 % — ABNORMAL HIGH (ref 11.6–15.4)
WBC: 5.9 10*3/uL (ref 3.4–10.8)

## 2020-11-26 ENCOUNTER — Encounter: Payer: Self-pay | Admitting: *Deleted

## 2020-11-26 DIAGNOSIS — Z7984 Long term (current) use of oral hypoglycemic drugs: Secondary | ICD-10-CM | POA: Diagnosis not present

## 2020-11-26 DIAGNOSIS — Z794 Long term (current) use of insulin: Secondary | ICD-10-CM | POA: Diagnosis not present

## 2020-11-26 DIAGNOSIS — E119 Type 2 diabetes mellitus without complications: Secondary | ICD-10-CM | POA: Diagnosis not present

## 2020-12-01 DIAGNOSIS — E113491 Type 2 diabetes mellitus with severe nonproliferative diabetic retinopathy without macular edema, right eye: Secondary | ICD-10-CM | POA: Diagnosis not present

## 2020-12-01 DIAGNOSIS — H25813 Combined forms of age-related cataract, bilateral: Secondary | ICD-10-CM | POA: Diagnosis not present

## 2020-12-01 DIAGNOSIS — E1136 Type 2 diabetes mellitus with diabetic cataract: Secondary | ICD-10-CM | POA: Diagnosis not present

## 2020-12-01 DIAGNOSIS — E113592 Type 2 diabetes mellitus with proliferative diabetic retinopathy without macular edema, left eye: Secondary | ICD-10-CM | POA: Diagnosis not present

## 2020-12-20 DIAGNOSIS — R319 Hematuria, unspecified: Secondary | ICD-10-CM | POA: Diagnosis not present

## 2020-12-23 DIAGNOSIS — E1136 Type 2 diabetes mellitus with diabetic cataract: Secondary | ICD-10-CM | POA: Diagnosis not present

## 2020-12-23 DIAGNOSIS — E113512 Type 2 diabetes mellitus with proliferative diabetic retinopathy with macular edema, left eye: Secondary | ICD-10-CM | POA: Diagnosis not present

## 2020-12-23 DIAGNOSIS — H25813 Combined forms of age-related cataract, bilateral: Secondary | ICD-10-CM | POA: Diagnosis not present

## 2020-12-23 DIAGNOSIS — E113411 Type 2 diabetes mellitus with severe nonproliferative diabetic retinopathy with macular edema, right eye: Secondary | ICD-10-CM | POA: Diagnosis not present

## 2020-12-30 DIAGNOSIS — K7460 Unspecified cirrhosis of liver: Secondary | ICD-10-CM | POA: Diagnosis not present

## 2020-12-30 DIAGNOSIS — K8020 Calculus of gallbladder without cholecystitis without obstruction: Secondary | ICD-10-CM | POA: Diagnosis not present

## 2021-01-04 DIAGNOSIS — E113512 Type 2 diabetes mellitus with proliferative diabetic retinopathy with macular edema, left eye: Secondary | ICD-10-CM | POA: Diagnosis not present

## 2021-01-04 DIAGNOSIS — Z01818 Encounter for other preprocedural examination: Secondary | ICD-10-CM | POA: Diagnosis not present

## 2021-01-04 DIAGNOSIS — R002 Palpitations: Secondary | ICD-10-CM | POA: Diagnosis not present

## 2021-01-04 DIAGNOSIS — E113411 Type 2 diabetes mellitus with severe nonproliferative diabetic retinopathy with macular edema, right eye: Secondary | ICD-10-CM | POA: Diagnosis not present

## 2021-01-04 DIAGNOSIS — H2511 Age-related nuclear cataract, right eye: Secondary | ICD-10-CM | POA: Diagnosis not present

## 2021-01-04 DIAGNOSIS — Z9842 Cataract extraction status, left eye: Secondary | ICD-10-CM | POA: Diagnosis not present

## 2021-01-04 DIAGNOSIS — E1136 Type 2 diabetes mellitus with diabetic cataract: Secondary | ICD-10-CM | POA: Diagnosis not present

## 2021-01-04 DIAGNOSIS — H25811 Combined forms of age-related cataract, right eye: Secondary | ICD-10-CM | POA: Diagnosis not present

## 2021-01-04 DIAGNOSIS — Z20822 Contact with and (suspected) exposure to covid-19: Secondary | ICD-10-CM | POA: Diagnosis not present

## 2021-01-06 DIAGNOSIS — H25811 Combined forms of age-related cataract, right eye: Secondary | ICD-10-CM | POA: Diagnosis not present

## 2021-01-07 DIAGNOSIS — Z9841 Cataract extraction status, right eye: Secondary | ICD-10-CM | POA: Diagnosis not present

## 2021-01-13 DIAGNOSIS — K7460 Unspecified cirrhosis of liver: Secondary | ICD-10-CM | POA: Diagnosis not present

## 2021-01-13 DIAGNOSIS — I2510 Atherosclerotic heart disease of native coronary artery without angina pectoris: Secondary | ICD-10-CM | POA: Diagnosis not present

## 2021-01-13 DIAGNOSIS — Z955 Presence of coronary angioplasty implant and graft: Secondary | ICD-10-CM | POA: Diagnosis not present

## 2021-01-13 DIAGNOSIS — R011 Cardiac murmur, unspecified: Secondary | ICD-10-CM | POA: Diagnosis not present

## 2021-01-19 DIAGNOSIS — Z87448 Personal history of other diseases of urinary system: Secondary | ICD-10-CM | POA: Diagnosis not present

## 2021-01-19 DIAGNOSIS — R399 Unspecified symptoms and signs involving the genitourinary system: Secondary | ICD-10-CM | POA: Diagnosis not present

## 2021-01-19 DIAGNOSIS — R310 Gross hematuria: Secondary | ICD-10-CM | POA: Diagnosis not present

## 2021-01-19 DIAGNOSIS — N529 Male erectile dysfunction, unspecified: Secondary | ICD-10-CM | POA: Diagnosis not present

## 2021-01-19 DIAGNOSIS — Z960 Presence of urogenital implants: Secondary | ICD-10-CM | POA: Diagnosis not present

## 2021-02-05 DIAGNOSIS — M19011 Primary osteoarthritis, right shoulder: Secondary | ICD-10-CM | POA: Diagnosis not present

## 2021-02-07 DIAGNOSIS — R319 Hematuria, unspecified: Secondary | ICD-10-CM | POA: Diagnosis not present

## 2021-02-07 DIAGNOSIS — R935 Abnormal findings on diagnostic imaging of other abdominal regions, including retroperitoneum: Secondary | ICD-10-CM | POA: Diagnosis not present

## 2021-02-07 DIAGNOSIS — R161 Splenomegaly, not elsewhere classified: Secondary | ICD-10-CM | POA: Diagnosis not present

## 2021-02-07 DIAGNOSIS — K7460 Unspecified cirrhosis of liver: Secondary | ICD-10-CM | POA: Diagnosis not present

## 2021-02-08 DIAGNOSIS — M19011 Primary osteoarthritis, right shoulder: Secondary | ICD-10-CM | POA: Diagnosis not present

## 2021-02-11 DIAGNOSIS — M25511 Pain in right shoulder: Secondary | ICD-10-CM | POA: Diagnosis not present

## 2021-02-11 DIAGNOSIS — G8929 Other chronic pain: Secondary | ICD-10-CM | POA: Diagnosis not present

## 2021-02-22 DIAGNOSIS — M25511 Pain in right shoulder: Secondary | ICD-10-CM | POA: Diagnosis not present

## 2021-03-04 DIAGNOSIS — Z23 Encounter for immunization: Secondary | ICD-10-CM | POA: Diagnosis not present

## 2021-03-07 DIAGNOSIS — E113412 Type 2 diabetes mellitus with severe nonproliferative diabetic retinopathy with macular edema, left eye: Secondary | ICD-10-CM | POA: Diagnosis not present

## 2021-03-07 DIAGNOSIS — H26493 Other secondary cataract, bilateral: Secondary | ICD-10-CM | POA: Diagnosis not present

## 2021-03-07 DIAGNOSIS — E113511 Type 2 diabetes mellitus with proliferative diabetic retinopathy with macular edema, right eye: Secondary | ICD-10-CM | POA: Diagnosis not present

## 2021-03-08 DIAGNOSIS — H04123 Dry eye syndrome of bilateral lacrimal glands: Secondary | ICD-10-CM | POA: Diagnosis not present

## 2021-03-08 DIAGNOSIS — Z48810 Encounter for surgical aftercare following surgery on the sense organs: Secondary | ICD-10-CM | POA: Diagnosis not present

## 2021-03-08 DIAGNOSIS — Z9841 Cataract extraction status, right eye: Secondary | ICD-10-CM | POA: Diagnosis not present

## 2021-03-08 DIAGNOSIS — H40052 Ocular hypertension, left eye: Secondary | ICD-10-CM | POA: Diagnosis not present

## 2021-03-08 DIAGNOSIS — E1139 Type 2 diabetes mellitus with other diabetic ophthalmic complication: Secondary | ICD-10-CM | POA: Diagnosis not present

## 2021-03-08 DIAGNOSIS — Z961 Presence of intraocular lens: Secondary | ICD-10-CM | POA: Diagnosis not present

## 2021-03-10 DIAGNOSIS — Z1389 Encounter for screening for other disorder: Secondary | ICD-10-CM | POA: Diagnosis not present

## 2021-03-10 DIAGNOSIS — Z135 Encounter for screening for eye and ear disorders: Secondary | ICD-10-CM | POA: Diagnosis not present

## 2021-03-10 DIAGNOSIS — M19011 Primary osteoarthritis, right shoulder: Secondary | ICD-10-CM | POA: Diagnosis not present

## 2021-03-29 DIAGNOSIS — Z961 Presence of intraocular lens: Secondary | ICD-10-CM | POA: Diagnosis not present

## 2021-03-29 DIAGNOSIS — H524 Presbyopia: Secondary | ICD-10-CM | POA: Diagnosis not present

## 2021-04-04 DIAGNOSIS — Z794 Long term (current) use of insulin: Secondary | ICD-10-CM | POA: Diagnosis not present

## 2021-04-04 DIAGNOSIS — Z7984 Long term (current) use of oral hypoglycemic drugs: Secondary | ICD-10-CM | POA: Diagnosis not present

## 2021-04-04 DIAGNOSIS — E119 Type 2 diabetes mellitus without complications: Secondary | ICD-10-CM | POA: Diagnosis not present

## 2021-04-04 DIAGNOSIS — Z7985 Long-term (current) use of injectable non-insulin antidiabetic drugs: Secondary | ICD-10-CM | POA: Diagnosis not present

## 2021-04-15 DIAGNOSIS — Z794 Long term (current) use of insulin: Secondary | ICD-10-CM | POA: Diagnosis not present

## 2021-04-15 DIAGNOSIS — E1140 Type 2 diabetes mellitus with diabetic neuropathy, unspecified: Secondary | ICD-10-CM | POA: Diagnosis not present

## 2021-04-15 DIAGNOSIS — Z7984 Long term (current) use of oral hypoglycemic drugs: Secondary | ICD-10-CM | POA: Diagnosis not present

## 2021-04-15 DIAGNOSIS — E119 Type 2 diabetes mellitus without complications: Secondary | ICD-10-CM | POA: Diagnosis not present

## 2021-04-15 DIAGNOSIS — L820 Inflamed seborrheic keratosis: Secondary | ICD-10-CM | POA: Diagnosis not present

## 2021-04-19 DIAGNOSIS — H43813 Vitreous degeneration, bilateral: Secondary | ICD-10-CM | POA: Diagnosis not present

## 2021-04-19 DIAGNOSIS — H35371 Puckering of macula, right eye: Secondary | ICD-10-CM | POA: Diagnosis not present

## 2021-04-19 DIAGNOSIS — E113513 Type 2 diabetes mellitus with proliferative diabetic retinopathy with macular edema, bilateral: Secondary | ICD-10-CM | POA: Diagnosis not present

## 2021-04-19 DIAGNOSIS — H3561 Retinal hemorrhage, right eye: Secondary | ICD-10-CM | POA: Diagnosis not present

## 2021-04-27 DIAGNOSIS — G5611 Other lesions of median nerve, right upper limb: Secondary | ICD-10-CM | POA: Diagnosis not present

## 2021-05-03 DIAGNOSIS — E1140 Type 2 diabetes mellitus with diabetic neuropathy, unspecified: Secondary | ICD-10-CM | POA: Diagnosis not present

## 2021-05-03 DIAGNOSIS — Z7985 Long-term (current) use of injectable non-insulin antidiabetic drugs: Secondary | ICD-10-CM | POA: Diagnosis not present

## 2021-05-03 DIAGNOSIS — Z7984 Long term (current) use of oral hypoglycemic drugs: Secondary | ICD-10-CM | POA: Diagnosis not present

## 2021-05-03 DIAGNOSIS — E113293 Type 2 diabetes mellitus with mild nonproliferative diabetic retinopathy without macular edema, bilateral: Secondary | ICD-10-CM | POA: Diagnosis not present

## 2021-05-20 DIAGNOSIS — R079 Chest pain, unspecified: Secondary | ICD-10-CM | POA: Diagnosis not present

## 2021-05-20 DIAGNOSIS — G5621 Lesion of ulnar nerve, right upper limb: Secondary | ICD-10-CM | POA: Diagnosis not present

## 2021-05-24 DIAGNOSIS — E113512 Type 2 diabetes mellitus with proliferative diabetic retinopathy with macular edema, left eye: Secondary | ICD-10-CM | POA: Diagnosis not present

## 2021-05-24 DIAGNOSIS — H43811 Vitreous degeneration, right eye: Secondary | ICD-10-CM | POA: Diagnosis not present

## 2021-05-24 DIAGNOSIS — H35371 Puckering of macula, right eye: Secondary | ICD-10-CM | POA: Diagnosis not present

## 2021-05-28 IMAGING — DX DG CHEST 1V PORT
1 series · 1 of 1 positions shown · non-contrast
Comparison: None.

CLINICAL DATA: 60-year-old male with weakness and unexplained
anemia.

EXAM:
PORTABLE CHEST 1 VIEW

[chest]
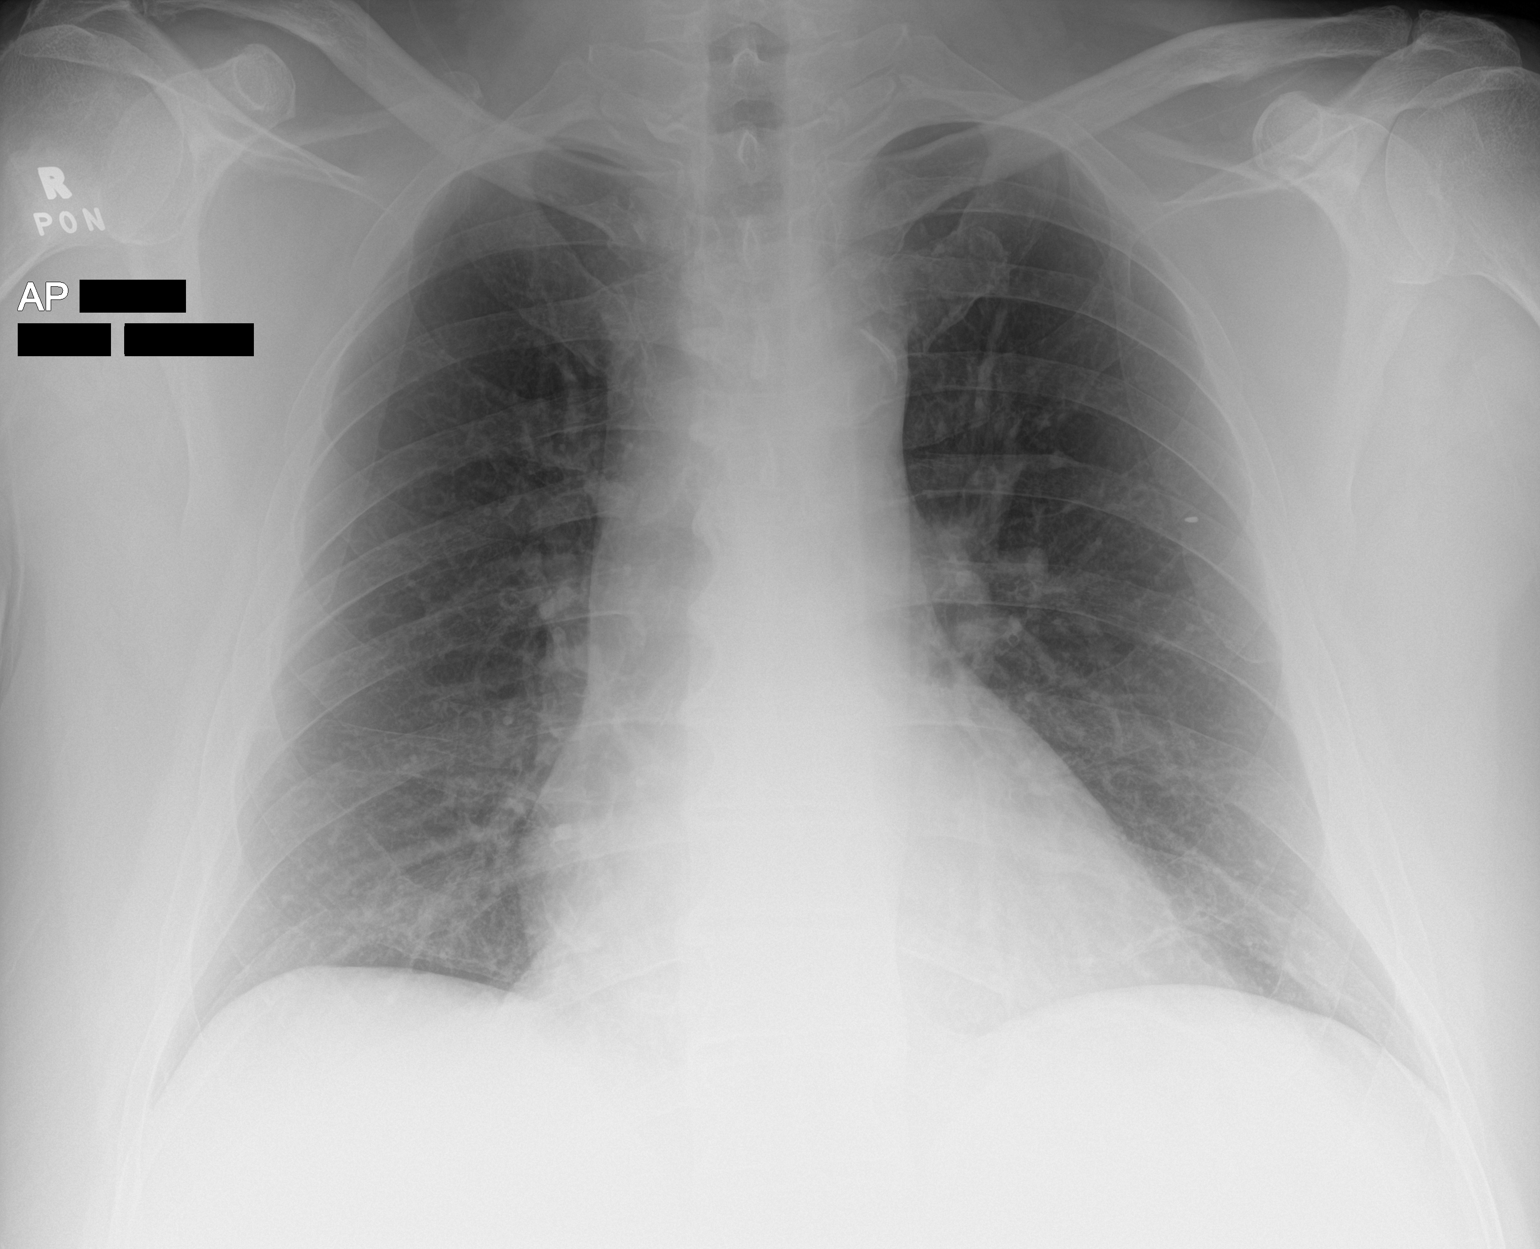

[1 of 1 positions shown; findings below may reference images not displayed]

FINDINGS: Portable AP semi upright view at 6933 hours. Normal lung volumes and
mediastinal contours. Visualized tracheal air column is within
normal limits. Tiny metallic density foreign body projects at the
lateral left lung. Elsewhere Allowing for portable technique the
lungs are clear. No pneumothorax or pleural effusion. No osseous
abnormality identified.
IMPRESSION: Negative portable chest; tiny left chest metallic foreign body.

## 2021-06-02 ENCOUNTER — Other Ambulatory Visit: Payer: Self-pay

## 2021-06-02 ENCOUNTER — Ambulatory Visit (INDEPENDENT_AMBULATORY_CARE_PROVIDER_SITE_OTHER): Payer: No Typology Code available for payment source

## 2021-06-02 ENCOUNTER — Ambulatory Visit (INDEPENDENT_AMBULATORY_CARE_PROVIDER_SITE_OTHER): Payer: No Typology Code available for payment source | Admitting: Orthopaedic Surgery

## 2021-06-02 DIAGNOSIS — M25511 Pain in right shoulder: Secondary | ICD-10-CM

## 2021-06-02 DIAGNOSIS — M542 Cervicalgia: Secondary | ICD-10-CM | POA: Diagnosis not present

## 2021-06-02 DIAGNOSIS — G8929 Other chronic pain: Secondary | ICD-10-CM

## 2021-06-02 DIAGNOSIS — M7541 Impingement syndrome of right shoulder: Secondary | ICD-10-CM | POA: Diagnosis not present

## 2021-06-02 NOTE — Progress Notes (Signed)
Office Visit Note   Patient: Corey Silva           Date of Birth: 03/07/1961           MRN: [id number] Visit Date: 06/02/2021              Requested by: Erick Alley, MD [address] [place],  [place] [address] PCP: Erick Alley, MD   Assessment & Plan: Visit Diagnoses:  1. Chronic right shoulder pain   2. Neck pain   3. Impingement syndrome of right shoulder     Plan: Given his worsening radicular symptoms combined with neck pain, I do feel a MRI of the cervical spine at this point is warranted to rule out nerve compression to the right side.  I would also like Dr. Alvester Morin to see him for a one-time steroid injection in his right [facility] joint under fluoroscopy to see if this will help with his symptoms.  Given the severity of his arthritis of the [facility] joint he may end up needing arthroscopic intervention at some point.  We do need to explore his cervical spine issues given the radicular symptoms that are worsening of his right upper extremity combined with the numbness and tingling and weakness.  He agrees with this treatment plan.  All questions and concerns were answered and addressed.  Follow-Up Instructions: Return in about 3 weeks (around 06/23/2021).   Orders:  Orders Placed This Encounter  Procedures   XR Shoulder Right   XR Cervical Spine 2 or 3 views   No orders of the defined types were placed in this encounter.     Procedures: No procedures performed   Clinical Data: No additional findings.   Subjective: Chief Complaint  Patient presents with   Right Shoulder - Pain   Neck - Pain  The patient is a right-hand-dominant 61 year old gentleman referred from the [facility] to evaluate and treat neck pain and right shoulder pain but he also has numbness and tingling going down his right arm into his right hand.  He thought he had an MRI of his cervical spine but has had an MRI of his right shoulder.  He does hurt with overhead activities and has been progressively  getting worse.  He says his head feels heavy and his neck feels heavy and overall he is just not feeling well at all.  He is a diabetic but reports good blood glucose control.  He did have a trigger point injection in his right trapezius area and a steroid injection subacromial outlet about 5 weeks to 6 weeks ago.  He says that is really helped minimally.  He denies any left upper extremity symptoms.  HPI  Review of Systems There is currently listed no fever, chills, nausea, vomiting  Objective: Vital Signs: There were no vitals taken for this visit.  Physical Exam He is alert and orient x3 and in no acute distress Ortho Exam Examination of his right shoulder shows no weakness in the shoulder is well located with smooth range of motion but very painful especially the [facility] joint.  There is some subacromial outlet pain but he does have positive Neer and Hawkins signs.  Examination of her cervical spine shows pain throughout the flexion extension and some limitations of motion of that neck with lateral rotation and bending.  He does have a positive Spurling sign to the right side.  There is some slight weakness in his triceps on the right and numbness in his right  hand. Specialty Comments:  No specialty comments available.  Imaging: XR Cervical Spine 2 or 3 views  Result Date: 06/02/2021 2 views of the cervical spine show degenerative changes at multiple levels.  There is loss of the cervical lordosis.  XR Shoulder Right  Result Date: 06/02/2021 3 views of the right shoulder show no acute findings.  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    PMFS History: Patient Active Problem List   Diagnosis Date Noted   Angina pectoris (HCC) 08/05/2019   Hyperlipidemia 08/05/2019   HTN (hypertension) 08/05/2019   Type 2 diabetes mellitus with complication, with long-term current use of insulin (HCC) 08/05/2019   Past Medical History:  Diagnosis Date   Cirrhosis (HCC)    Coronary artery  disease    Diabetes mellitus without complication (HCC)    Hypertension     No family history on file.  Past Surgical History:  Procedure Laterality Date   LEFT HEART CATH AND CORONARY ANGIOGRAPHY N/A 08/05/2019   Procedure: LEFT HEART CATH AND CORONARY ANGIOGRAPHY;  Surgeon: Swazil[place], Peter M, MD;  Location: [facility] INVASIVE CV LAB;  Service: Cardiovascular;  Laterality: N/A;   Social History   Occupational History   Not on file  Tobacco Use   Smoking status: Never   Smokeless tobacco: Never  Substance and Sexual Activity   Alcohol use: Not Currently   Drug use: Never   Sexual activity: Yes

## 2021-06-22 ENCOUNTER — Ambulatory Visit (INDEPENDENT_AMBULATORY_CARE_PROVIDER_SITE_OTHER): Payer: No Typology Code available for payment source | Admitting: Physical Medicine and Rehabilitation

## 2021-06-22 ENCOUNTER — Ambulatory Visit: Payer: Self-pay

## 2021-06-22 ENCOUNTER — Encounter: Payer: Self-pay | Admitting: Physical Medicine and Rehabilitation

## 2021-06-22 ENCOUNTER — Other Ambulatory Visit: Payer: Self-pay

## 2021-06-22 DIAGNOSIS — M25511 Pain in right shoulder: Secondary | ICD-10-CM | POA: Diagnosis not present

## 2021-06-22 NOTE — Progress Notes (Signed)
° °  Corey Silva - 61 y.o. male MRN [id number]  Date of birth: August 22, 1960  Office Visit Note: Visit Date: 06/22/2021 PCP: Erick Alley, MD Referred by: Erick Alley, MD  Subjective: Chief Complaint  Patient presents with   Right Shoulder - Pain   HPI:  Corey Silva is a 61 y.o. male who comes in today at the request of Dr. Doneen Poisson for planned Right anesthetic [facility] joint arthrogram with fluoroscopic guidance.  The patient has failed conservative care including home exercise, medications, time and activity modification.  This injection will be diagnostic and hopefully therapeutic.  Please see requesting physician notes for further details and justification.   ROS Otherwise per HPI.  Assessment & Plan: Visit Diagnoses:    ICD-10-CM   1. Arthralgia of right acromioclavicular joint  M25.511 XR C-ARM NO REPORT    Medium Joint Inj: R acromioclavicular      Plan: No additional findings.   Meds & Orders: No orders of the defined types were placed in this encounter.   Orders Placed This Encounter  Procedures   Medium Joint Inj: R acromioclavicular   XR C-ARM NO REPORT    Follow-up: Return if symptoms worsen or fail to improve.   Procedures: Medium Joint Inj: R acromioclavicular on 06/22/2021 9:34 AM Indications: pain Details: 3.5 in fluoroscopy-guided superior approach Medications: 40 mg triamcinolone acetonide 40 MG/ML; 2 mL bupivacaine 0.5 %  There was excellent flow of contrast producing partial arthrogram of the AC joint. Consent was given by the patient. Immediately prior to procedure a time out was called to verify the correct patient, procedure, equipment, support staff and site/side marked as required. Patient was prepped and draped in the usual sterile fashion.         Clinical History: No specialty comments available.     Objective:  VS:  HT:     WT:    BMI:      BP:    HR: bpm   TEMP: ( )   RESP:  Physical Exam   Imaging: No results found.

## 2021-06-22 NOTE — Progress Notes (Signed)
Pt state right shoulder pain. Pt state any movement with right arm makes the pain worse. Pt state he takes over the counter pain meds to help ease his pain.  Numeric Pain Rating Scale and Functional Assessment Average Pain 8   In the last MONTH (on 0-10 scale) has pain interfered with the following?  1. General activity like being  able to carry out your everyday physical activities such as walking, climbing stairs, carrying groceries, or moving a chair?  Rating(10)   -BT, -Dye Allergies.

## 2021-06-23 ENCOUNTER — Ambulatory Visit (INDEPENDENT_AMBULATORY_CARE_PROVIDER_SITE_OTHER): Payer: No Typology Code available for payment source | Admitting: Orthopaedic Surgery

## 2021-06-23 ENCOUNTER — Encounter: Payer: Self-pay | Admitting: Orthopaedic Surgery

## 2021-06-23 DIAGNOSIS — M542 Cervicalgia: Secondary | ICD-10-CM

## 2021-06-23 NOTE — Progress Notes (Signed)
The patient is a 61 year old gentleman from the [facility] without seen recently with right shoulder pain but also right-sided neck pain and radicular symptoms going down his right arm.  He had had an MRI of the right shoulder and it showed some impingement and mainly arthritic changes at the [facility] joint on the right shoulder.  Yesterday he had an intra-articular injection under fluoroscopy in his right AC joint.  He says that is really helped in terms of just the numbness because a throbbing pain is gone.  He understands that we will take a while for the steroid to have an effect.  Unfortunately, he has not had the MRI of his cervical spine yet as we ordered it.  He does have a penile implant and was not sure if that was metal or not which does not.  He said that they were calling him to reschedule that MRI.  His right shoulder moves much more fluidly and smoothly than it had before.  The pain at the [facility] joint is minimal but some of that is just from the injection he had yesterday.  Hopefully the steroid will help to subside even more so over the next few weeks.  He will call to reschedule appointment for [place] to go over the MRI of the cervical spine once he has this done.  All questions and concerns were answered and addressed.

## 2021-07-03 MED ORDER — TRIAMCINOLONE ACETONIDE 40 MG/ML IJ SUSP
40.0000 mg | INTRAMUSCULAR | Status: AC | PRN
  Administered 2021-06-22: 40 mg via INTRA_ARTICULAR

## 2021-07-03 MED ORDER — BUPIVACAINE HCL 0.5 % IJ SOLN
2.0000 mL | INTRAMUSCULAR | Status: AC | PRN
  Administered 2021-06-22: 2 mL via INTRA_ARTICULAR

## 2021-07-11 DIAGNOSIS — K760 Fatty (change of) liver, not elsewhere classified: Secondary | ICD-10-CM | POA: Diagnosis not present

## 2021-07-11 DIAGNOSIS — K7460 Unspecified cirrhosis of liver: Secondary | ICD-10-CM | POA: Diagnosis not present

## 2021-07-25 ENCOUNTER — Ambulatory Visit: Payer: No Typology Code available for payment source | Admitting: Orthopaedic Surgery

## 2021-08-01 ENCOUNTER — Encounter: Payer: Self-pay | Admitting: Orthopaedic Surgery

## 2021-08-01 ENCOUNTER — Ambulatory Visit (INDEPENDENT_AMBULATORY_CARE_PROVIDER_SITE_OTHER): Payer: No Typology Code available for payment source | Admitting: Orthopaedic Surgery

## 2021-08-01 ENCOUNTER — Other Ambulatory Visit: Payer: Self-pay

## 2021-08-01 DIAGNOSIS — M25511 Pain in right shoulder: Secondary | ICD-10-CM

## 2021-08-01 DIAGNOSIS — G8929 Other chronic pain: Secondary | ICD-10-CM

## 2021-08-01 DIAGNOSIS — M7541 Impingement syndrome of right shoulder: Secondary | ICD-10-CM

## 2021-08-01 DIAGNOSIS — M542 Cervicalgia: Secondary | ICD-10-CM | POA: Diagnosis not present

## 2021-08-01 NOTE — Progress Notes (Signed)
The patient comes in for due to over thanks today.  He is following up for his right shoulder.  He has had a steroid injection in the [facility] joint of that shoulder due to severe AC joint arthrosis.  He also has impingement syndrome of the right shoulder.  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  He likely has a SLAP tear as well for the right shoulder.  He is also been dealing with neck pain and some pain that radiates down his left arm.  We sent him for an MRI to assess his cervical spine for nerve impingement. ? ?The cervical spine MRI results were reviewed with him.  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  There is also stenosis at C4-C5 and C5-C6 as well as C6-C7 all worse on the left side.  He still complains of pain going down the left arm. ? ?From a right shoulder standpoint, I have recommended arthroscopic intervention with subacromial decompression and debridement of the [facility] joint.  I explained in detail what the surgery involves.  We would also assess the rotator cuff and perform debridement in this area as needed.  [facility].  He does have our surgery scheduler's card if he decides to have arthroscopic surgery on his right shoulder.  He will give [place] a call.  All questions and concerns were answered and addressed. ?

## 2021-08-10 ENCOUNTER — Other Ambulatory Visit: Payer: Self-pay

## 2021-08-10 ENCOUNTER — Emergency Department (HOSPITAL_COMMUNITY)
Admission: EM | Admit: 2021-08-10 | Discharge: 2021-08-10 | Disposition: A | Discharge status: Home / Self Care | Payer: No Typology Code available for payment source | Attending: Emergency Medicine | Admitting: Emergency Medicine

## 2021-08-10 ENCOUNTER — Emergency Department (HOSPITAL_COMMUNITY): Payer: No Typology Code available for payment source

## 2021-08-10 DIAGNOSIS — E871 Hypo-osmolality and hyponatremia: Secondary | ICD-10-CM | POA: Diagnosis not present

## 2021-08-10 DIAGNOSIS — Z7982 Long term (current) use of aspirin: Secondary | ICD-10-CM | POA: Diagnosis not present

## 2021-08-10 DIAGNOSIS — I2510 Atherosclerotic heart disease of native coronary artery without angina pectoris: Secondary | ICD-10-CM | POA: Diagnosis not present

## 2021-08-10 DIAGNOSIS — S2020XA Contusion of thorax, unspecified, initial encounter: Secondary | ICD-10-CM | POA: Diagnosis not present

## 2021-08-10 DIAGNOSIS — R079 Chest pain, unspecified: Secondary | ICD-10-CM

## 2021-08-10 DIAGNOSIS — R778 Other specified abnormalities of plasma proteins: Secondary | ICD-10-CM | POA: Insufficient documentation

## 2021-08-10 DIAGNOSIS — W19XXXA Unspecified fall, initial encounter: Secondary | ICD-10-CM | POA: Diagnosis not present

## 2021-08-10 DIAGNOSIS — Z7984 Long term (current) use of oral hypoglycemic drugs: Secondary | ICD-10-CM | POA: Diagnosis not present

## 2021-08-10 DIAGNOSIS — R000 Tachycardia, unspecified: Secondary | ICD-10-CM | POA: Diagnosis not present

## 2021-08-10 DIAGNOSIS — E119 Type 2 diabetes mellitus without complications: Secondary | ICD-10-CM | POA: Diagnosis not present

## 2021-08-10 DIAGNOSIS — I10 Essential (primary) hypertension: Secondary | ICD-10-CM | POA: Insufficient documentation

## 2021-08-10 DIAGNOSIS — Z79899 Other long term (current) drug therapy: Secondary | ICD-10-CM | POA: Insufficient documentation

## 2021-08-10 DIAGNOSIS — Z794 Long term (current) use of insulin: Secondary | ICD-10-CM | POA: Insufficient documentation

## 2021-08-10 DIAGNOSIS — E860 Dehydration: Secondary | ICD-10-CM | POA: Diagnosis not present

## 2021-08-10 DIAGNOSIS — K7460 Unspecified cirrhosis of liver: Secondary | ICD-10-CM | POA: Diagnosis not present

## 2021-08-10 DIAGNOSIS — S299XXA Unspecified injury of thorax, initial encounter: Secondary | ICD-10-CM | POA: Diagnosis present

## 2021-08-10 LAB — I-STAT VENOUS BLOOD GAS, ED
Acid-base deficit: 4 mmol/L — ABNORMAL HIGH (ref 0.0–2.0)
Bicarbonate: 22.5 mmol/L (ref 20.0–28.0)
Calcium, Ion: 1.26 mmol/L (ref 1.15–1.40)
HCT: 49 % (ref 39.0–52.0)
Hemoglobin: 16.7 g/dL (ref 13.0–17.0)
O2 Saturation: 63 %
Potassium: 3.9 mmol/L (ref 3.5–5.1)
Sodium: 136 mmol/L (ref 135–145)
TCO2: 24 mmol/L (ref 22–32)
pCO2, Ven: 46.5 mmHg (ref 44–60)
pH, Ven: 7.292 (ref 7.25–7.43)
pO2, Ven: 37 mmHg (ref 32–45)

## 2021-08-10 LAB — CBC
HCT: 47.1 % (ref 39.0–52.0)
Hemoglobin: 16.2 g/dL (ref 13.0–17.0)
MCH: 28.8 pg (ref 26.0–34.0)
MCHC: 34.4 g/dL (ref 30.0–36.0)
MCV: 83.7 fL (ref 80.0–100.0)
Platelets: 143 10*3/uL — ABNORMAL LOW (ref 150–400)
RBC: 5.63 MIL/uL (ref 4.22–5.81)
RDW: 14.6 % (ref 11.5–15.5)
WBC: 10.2 10*3/uL (ref 4.0–10.5)
nRBC: 0 % (ref 0.0–0.2)

## 2021-08-10 LAB — BETA-HYDROXYBUTYRIC ACID: Beta-Hydroxybutyric Acid: 0.4 mmol/L — ABNORMAL HIGH (ref 0.05–0.27)

## 2021-08-10 LAB — URINALYSIS, ROUTINE W REFLEX MICROSCOPIC
Bilirubin Urine: NEGATIVE
Glucose, UA: 50 mg/dL — AB
Hgb urine dipstick: NEGATIVE
Ketones, ur: NEGATIVE mg/dL
Leukocytes,Ua: NEGATIVE
Nitrite: NEGATIVE
Protein, ur: NEGATIVE mg/dL
Specific Gravity, Urine: 1.025 (ref 1.005–1.030)
pH: 5 (ref 5.0–8.0)

## 2021-08-10 LAB — BASIC METABOLIC PANEL
Anion gap: 12 (ref 5–15)
BUN: 21 mg/dL (ref 8–23)
CO2: 22 mmol/L (ref 22–32)
Calcium: 9.9 mg/dL (ref 8.9–10.3)
Chloride: 100 mmol/L (ref 98–111)
Creatinine, Ser: 0.96 mg/dL (ref 0.61–1.24)
GFR, Estimated: >60 mL/min (ref >60)
Glucose, Bld: 293 mg/dL — ABNORMAL HIGH (ref 70–99)
Potassium: 3.8 mmol/L (ref 3.5–5.1)
Sodium: 134 mmol/L — ABNORMAL LOW (ref 135–145)

## 2021-08-10 LAB — LIPASE, BLOOD: Lipase: 34 U/L (ref 11–51)

## 2021-08-10 LAB — TROPONIN I (HIGH SENSITIVITY)
Troponin I (High Sensitivity): 36 ng/L — ABNORMAL HIGH (ref <18)
Troponin I (High Sensitivity): 32 ng/L — ABNORMAL HIGH (ref <18)

## 2021-08-10 LAB — CBG MONITORING, ED: Glucose-Capillary: 263 mg/dL — ABNORMAL HIGH (ref 70–99)

## 2021-08-10 LAB — MAGNESIUM: Magnesium: 2 mg/dL (ref 1.7–2.4)

## 2021-08-10 MED ORDER — ONDANSETRON 4 MG PO TBDP: 4.0000 mg | ORAL_TABLET | Freq: Three times a day (TID) | ORAL | 0 refills | Status: AC | PRN

## 2021-08-10 MED ORDER — SODIUM CHLORIDE 0.9 % IV BOLUS
1000.0000 mL | Freq: Once | INTRAVENOUS | Status: AC
  Administered 2021-08-10: 1000 mL via INTRAVENOUS

## 2021-08-10 NOTE — ED Notes (Signed)
,  pt refuse toileting pt have label specimen  ?

## 2021-08-10 NOTE — ED Provider Notes (Signed)
?[facility] ?Provider Note ? ? ?CSN: [id number] ?Arrival date & time: 08/10/21  [id number] ? ?  ? ?History ?Chief Complaint  ?Patient presents with  ? Chest Pain  ? Nausea  ? Fall  ? Loss of Consciousness  ? ? ?Corey CreekMark Briceno is a 61 y.o. male with history of coronary artery disease, diabetes mellitus requiring insulin, hypertension, and cirrhosis who presents to the emergency department with chest pain.  Patient states over the last several days he has been feeling ill and has had decreased p.o. intake and dry heaving.  Patient also states that his sugars have been elevated.  Yesterday, patient was walking when he felt weak and felt that he passed out.  He did strike his anterior chest wall against a cabinet.  Since then he has been having primarily substernal left-sided chest pain which she describes as a sharp sensation that radiates behind the left shoulder. ? ? ?Chest Pain ?Associated symptoms: syncope   ?Fall ?Associated symptoms include chest pain.  ?Loss of Consciousness ?Associated symptoms: chest pain   ? ?  ? ?Home Medications ?Prior to Admission medications   ?Medication Sig Start Date End Date Taking? Authorizing Provider  ?acetaminophen (TYLENOL) 500 MG tablet Take 1,000 mg by mouth every 6 (six) hours as needed for mild pain.   Yes [provider]  ?Apoaequorin (PREVAGEN EXTRA STRENGTH) 20 MG CAPS Take 20 tablets by mouth daily.   Yes [provider]  ?aspirin EC 81 MG tablet Take 81 mg by mouth at bedtime.   Yes [provider]  ?busPIRone (BUSPAR) 10 MG tablet Take 20 mg by mouth 2 (two) times daily.    Yes [provider]  ?docusate sodium (COLACE) 100 MG capsule Take 100 mg by mouth daily.   Yes [provider]  ?DORZOLAMIDE HCL-TIMOLOL MAL OP Place 1 drop into the left eye daily.   Yes [provider]  ?DULoxetine (CYMBALTA) 60 MG capsule Take 60 mg by mouth 2 (two) times daily.    Yes [provider]   ?ferrous sulfate 325 (65 FE) MG tablet Take 1 tablet (325 mg total) by mouth daily. 08/12/20  Yes Carroll SageFaulkner, William J, PA-C  ?gabapentin (NEURONTIN) 300 MG capsule Take 1,200 mg by mouth 3 (three) times daily.   Yes [provider]  ?HUMULIN R U-500 KWIKPEN 500 UNIT/ML kwikpen Inject 50 Units into the skin 2 (two) times daily with a meal. 01/07/20  Yes [provider]  ?hydrochlorothiazide (HYDRODIURIL) 25 MG tablet Take 25 mg by mouth every evening.   Yes [provider]  ?isosorbide mononitrate (IMDUR) 60 MG 24 hr tablet Take 60 mg by mouth at bedtime.   Yes [provider]  ?losartan (COZAAR) 100 MG tablet Take 100 mg by mouth at bedtime.   Yes [provider]  ?metFORMIN (GLUCOPHAGE-XR) 500 MG 24 hr tablet Take 2 tablets (1,000 mg total) by mouth in the morning and at bedtime. 08/08/19  Yes [place], Peter M, MD  ?metoprolol succinate (TOPROL-XL) 100 MG 24 hr tablet Take 1 tablet (100 mg total) by mouth daily. Take with or immediately following a meal. ?Patient taking differently: Take 100 mg by mouth every evening. 08/11/20 10/15/21 Yes Little IshikawaSchumann, Christopher L, MD  ?Multiple Vitamins-Minerals (MULTIVITAMIN WITH MINERALS) tablet Take 1 tablet by mouth daily.   Yes [provider]  ?Multiple Vitamins-Minerals (PRESERVISION AREDS PO) Take 1 tablet by mouth in the morning and at bedtime.   Yes [provider]  ?  nitroGLYCERIN (NITROSTAT) 0.4 MG SL tablet Place 1 tablet (0.4 mg total) under the tongue every 5 (five) minutes as needed for chest pain. ?Patient taking differently: Place under the tongue every 5 (five) minutes as needed for chest pain. 08/01/19 10/15/21 Yes Little Ishikawa, MD  ?omeprazole (PRILOSEC OTC) 20 MG tablet Take 20 mg by mouth daily.   Yes [provider]  ?ondansetron (ZOFRAN-ODT) 4 MG disintegrating tablet Take 1 tablet (4 mg total) by mouth every 8 (eight) hours as needed for nausea or vomiting. 08/10/21  Yes Teressa Lower, PA-C  ?OZEMPIC, 0.25 OR 0.5 MG/DOSE, 2 MG/1.5ML SOPN Inject 0.25 mg into the skin once a week. 01/07/20  Yes [provider]  ?Polyethyl Glycol-Propyl Glycol (SYSTANE OP) Place 1 drop into both eyes 3 (three) times daily as needed (dry eye).   Yes [provider]  ?pregabalin (LYRICA) 225 MG capsule Take 225 mg by mouth 2 (two) times daily.    Yes [provider]  ?rosuvastatin (CRESTOR) 40 MG tablet Take 40 mg by mouth every evening.   Yes [provider]  ?tamsulosin (FLOMAX) 0.4 MG CAPS capsule Take 0.4 mg by mouth at bedtime.   Yes [provider]  ?vitamin B-12 (CYANOCOBALAMIN) 500 MCG tablet Take 500 mcg by mouth daily.   Yes [provider]  ?vitamin C (ASCORBIC ACID) 500 MG tablet Take 500 mg by mouth daily.   Yes [provider]  ?Vitamin D, Cholecalciferol, 25 MCG (1000 UT) TABS Take 1,000 mg by mouth daily.    Yes [provider]  ?XIIDRA 5 % SOLN Place 1 drop into both eyes 2 (two) times daily. 05/27/21  Yes [provider]  ?docusate sodium (COLACE) 250 MG capsule Take 1 capsule (250 mg total) by mouth daily. ?Patient not taking: Reported on 08/10/2021 08/12/20   Carroll Sage, PA-C  ?metFORMIN (GLUCOPHAGE) 500 MG tablet Take 1,000 mg by mouth 2 (two) times daily. ?Patient not taking: Reported on 08/10/2021 07/20/21   [provider]  ?   ? ?Allergies    ?Pseudoephedrine   ? ?Review of Systems   ?Review of Systems  ?Cardiovascular:  Positive for chest pain and syncope.  ?All other systems reviewed and are negative. ? ?Physical Exam ?Updated Vital Signs ?BP 136/81   Pulse 80   Temp 97.8 ?F (36.6 ?C) (Oral)   Resp 14   SpO2 98%  ?Physical Exam ?Vitals and nursing note reviewed.  ?Constitutional:   ?   General: He is not in acute distress. ?   Appearance: Normal appearance.  ?   Comments: Diaphoretic.  ?HENT:  ?   Head: Normocephalic and atraumatic.  ?Eyes:  ?   General:     ?   Right eye: No discharge.     ?    Left eye: No discharge.  ?Cardiovascular:  ?   Rate and Rhythm: Regular rhythm. Tachycardia present.  ?   Heart sounds: Murmur heard.  ?Pulmonary:  ?   Comments: Clear to auscultation bilaterally.  Normal effort.  No respiratory distress.  No evidence of wheezes, rales, or rhonchi heard throughout. ?Chest:  ?   Comments: Small area of ecchymosis over the substernal chest. ?Abdominal:  ?   General: Abdomen is flat. Bowel sounds are normal. There is no distension.  ?   Tenderness: There is no abdominal tenderness. There is no guarding or rebound.  ?Musculoskeletal:     ?   General: Normal range of motion.  ?  Cervical back: Neck supple.  ?   Right lower leg: No edema.  ?   Left lower leg: No edema.  ?Skin: ?   General: Skin is warm and dry.  ?   Findings: No rash.  ?Neurological:  ?   General: No focal deficit present.  ?   Mental Status: He is alert.  ?Psychiatric:     ?   Mood and Affect: Mood normal.     ?   Behavior: Behavior normal.  ? ? ?ED Results / Procedures / Treatments   ?Labs ?(all labs ordered are listed, but only abnormal results are displayed) ?Labs Reviewed  ?BASIC METABOLIC PANEL - Abnormal; Notable for the following components:  ?    Result Value  ? Sodium 134 (*)   ? Glucose, Bld 293 (*)   ? All other components within normal limits  ?CBC - Abnormal; Notable for the following components:  ? Platelets 143 (*)   ? All other components within normal limits  ?BETA-HYDROXYBUTYRIC ACID - Abnormal; Notable for the following components:  ? Beta-Hydroxybutyric Acid 0.40 (*)   ? All other components within normal limits  ?CBG MONITORING, ED - Abnormal; Notable for the following components:  ? Glucose-Capillary 263 (*)   ? All other components within normal limits  ?I-STAT VENOUS BLOOD GAS, ED - Abnormal; Notable for the following components:  ? Acid-base deficit 4.0 (*)   ? All other components within normal limits  ?TROPONIN I (HIGH SENSITIVITY) - Abnormal; Notable for the following components:  ? Troponin  I (High Sensitivity) 36 (*)   ? All other components within normal limits  ?TROPONIN I (HIGH SENSITIVITY) - Abnormal; Notable for the following components:  ? Troponin I (High Sensitivity) 32 (*)   ? All other comp

## 2021-08-10 NOTE — ED Triage Notes (Signed)
Pt. Stated, Ive had chest pain due to a fall from blacking out yesterday morning. Had some N/V 2 days ago . I feel fatigue and weak. Ive had stents in, so Im not sure if its related to my heart or medicines, I just dont know. ?

## 2021-08-10 NOTE — Discharge Instructions (Addendum)
Please drink plenty of fluids and get plenty of rest.  I have also given you a prescription for Zofran to go home with.  You can take this in the event that you start feeling nauseous and have vomiting again.  No like for you to follow-up with your primary care provider for further evaluation.  Return to the emergency department for any worsening symptoms you might have. ? ?Your urine showed mild dehydration.  ?

## 2021-08-10 NOTE — ED Notes (Signed)
Pt provided with water. Pt ambulated to bathroom independently with staff supervision for safety.  ?

## 2021-08-10 NOTE — ED Provider Notes (Signed)
I assumed care of patient from previous team at shift change, please see their note for full H&P. ?At the time of shift change on the patient's urine is still pending. ?Plan is to discharge patient home after UA results. ?UA is amber and hazy with slightly elevated specific gravity consistent with reported dehydration. ?There is no criteria for admission based on his UA and his UA does not have evidence of infection. ? ?Patient is discharged in accordance with plan established by previous team. ? ?Note: Portions of this report may have been transcribed using voice recognition software. Every effort was made to ensure accuracy; however, inadvertent computerized transcription errors may be present ? ?  ?Cristina Gong, [place] ?08/10/21 1536 ? ?  ?Jacalyn Lefevre, MD ?08/11/21 0715 ? ?

## 2021-08-16 ENCOUNTER — Telehealth: Payer: Self-pay

## 2021-08-16 NOTE — Telephone Encounter (Signed)
REFERRAL ONLY ?

## 2021-08-29 ENCOUNTER — Telehealth: Payer: Self-pay

## 2021-08-29 NOTE — Telephone Encounter (Signed)
I returned patient's voice mail he left for me to call him back regarding surgery scheduling.  I left him a voice mail for him to call me back. ?

## 2021-09-02 ENCOUNTER — Telehealth: Payer: Self-pay | Admitting: *Deleted

## 2021-09-02 NOTE — Telephone Encounter (Signed)
? ?  Pre-operative Risk Assessment  ?  ?Patient Name: Corey Silva  ?DOB: 10/26/1960 ?MRN: [id number]  ? ?  ? ?Request for Surgical Clearance   ? ?Procedure:   Right shoulder  arthroscopy ? ?Date of Surgery:  Clearance TBD                              ?   ?Surgeon:  Dr Doneen Poisson ?Surgeon's Group or Practice Name:  [facility] [place] ?Phone number:  [phone] ?Fax number:  [phone] ?  ?Type of Clearance Requested:   ?- Pharmacy:  Hold Aspirin   ?  ?Type of Anesthesia:  General +Block ?  ?Additional requests/questions:   ? ?Signed, ?Verneda Skill L   ?09/02/2021, 5:06 PM  ? ?

## 2021-09-05 DIAGNOSIS — L02211 Cutaneous abscess of abdominal wall: Secondary | ICD-10-CM | POA: Diagnosis not present

## 2021-09-05 NOTE — Telephone Encounter (Signed)
? ?  Name: Corey Silva  ?DOB: [birth year]  ?MRN: [id number] ? ?Primary Cardiologist: Little Ishikawa, MD ? ?Chart reviewed as part of pre-operative protocol coverage. Because of Lerone Haigler's past medical history and time since last visit, he will require a follow-up in-office visit in order to better assess preoperative cardiovascular risk. At last OV 11/2020, he was describing intermittent CP and palpitations with recommendation for a 6 month follow-up visit therefore would suggest visit for pre-op evaluation. Did not complete monitor as ordered. ? ?Pre-op covering staff: ?- Please schedule appointment and call patient to inform them. If patient already had an upcoming appointment within acceptable timeframe, please add "pre-op clearance" to the appointment notes so provider is aware. ?- Please contact requesting surgeon's office via preferred method (i.e, phone, fax) to inform them of need for appointment prior to surgery. ? ?This message will also be routed to Dr Bjorn Pippin for input on holding aspirin as requested below so that this information is available to the clearing provider at time of patient's appointment. Dr. Bjorn Pippin, per your note, patient has history of CAD with "Cath 11/2013 at [facility] in [place] showed 60% in-stent restenosis in proximal LAD, 20% in-stent restenosis in D1, 90% stenosis in distal RCA.  S/p DES to distal RCA.  Lexiscan Myoview on 07/23/2019 showed basal to mid anterior ischemia.  Cath on 08/05/2019 showed proximal RCA 30%, mid RCA 30%, RPL 40%, patent distal RCA stent, patent RPAV stent, RPAV 60%, mid LAD 50%, 100% D1 fills by left to left collaterals, 95% ostial D2 not amenable to PCI, normal LV systolic function." If patient is stable at upcoming OV, are you OK holding aspirin for shoulder surgery as requested? Please route response to P CV DIV PREOP (the pre-op pool). Thank you. ? ? ?Laurann Montana, PA-C  ?09/05/2021, 10:37 AM  ? ?

## 2021-09-06 NOTE — Telephone Encounter (Signed)
Pt has appt 10/25/21 with Dr. Bjorn Pippin. I will send FYI to requesting office pt has appt 10/25/21 ?See notes from Ronie Spies, [facility] ?

## 2021-09-14 DIAGNOSIS — L02219 Cutaneous abscess of trunk, unspecified: Secondary | ICD-10-CM | POA: Diagnosis not present

## 2021-09-14 DIAGNOSIS — L0291 Cutaneous abscess, unspecified: Secondary | ICD-10-CM | POA: Diagnosis not present

## 2021-09-16 DIAGNOSIS — Z4801 Encounter for change or removal of surgical wound dressing: Secondary | ICD-10-CM | POA: Diagnosis not present

## 2021-09-27 DIAGNOSIS — Z5181 Encounter for therapeutic drug level monitoring: Secondary | ICD-10-CM | POA: Diagnosis not present

## 2021-09-27 DIAGNOSIS — E1140 Type 2 diabetes mellitus with diabetic neuropathy, unspecified: Secondary | ICD-10-CM | POA: Diagnosis not present

## 2021-09-27 DIAGNOSIS — Z79899 Other long term (current) drug therapy: Secondary | ICD-10-CM | POA: Diagnosis not present

## 2021-09-27 DIAGNOSIS — M549 Dorsalgia, unspecified: Secondary | ICD-10-CM | POA: Diagnosis not present

## 2021-10-10 DIAGNOSIS — Z794 Long term (current) use of insulin: Secondary | ICD-10-CM | POA: Diagnosis not present

## 2021-10-10 DIAGNOSIS — E1140 Type 2 diabetes mellitus with diabetic neuropathy, unspecified: Secondary | ICD-10-CM | POA: Diagnosis not present

## 2021-10-10 DIAGNOSIS — Z23 Encounter for immunization: Secondary | ICD-10-CM | POA: Diagnosis not present

## 2021-10-10 DIAGNOSIS — I10 Essential (primary) hypertension: Secondary | ICD-10-CM | POA: Diagnosis not present

## 2021-10-11 DIAGNOSIS — K8020 Calculus of gallbladder without cholecystitis without obstruction: Secondary | ICD-10-CM | POA: Diagnosis not present

## 2021-10-11 DIAGNOSIS — E119 Type 2 diabetes mellitus without complications: Secondary | ICD-10-CM | POA: Diagnosis not present

## 2021-10-21 DIAGNOSIS — Z125 Encounter for screening for malignant neoplasm of prostate: Secondary | ICD-10-CM | POA: Diagnosis not present

## 2021-10-21 DIAGNOSIS — R351 Nocturia: Secondary | ICD-10-CM | POA: Diagnosis not present

## 2021-10-21 DIAGNOSIS — N401 Enlarged prostate with lower urinary tract symptoms: Secondary | ICD-10-CM | POA: Diagnosis not present

## 2021-10-21 DIAGNOSIS — R6882 Decreased libido: Secondary | ICD-10-CM | POA: Diagnosis not present

## 2021-10-24 NOTE — Progress Notes (Unsigned)
Cardiology Office Note:    Date:  10/25/2021   ID:  Corey Silva, DOB [birth year], MRN [id number]  PCP:  Erick Alley, MD  Cardiologist:  Little Ishikawa, MD  Electrophysiologist:  None   Referring MD: Erick Alley, MD   Chief Complaint  Patient presents with   Coronary Artery Disease    History of Present Illness:    Corey Silva is a 61 y.o. male with a hx of CAD, cirrhosis, type 2 diabetes, hypertension,hyperlipidemia who presents for follow-up.  He was referred by Dr. Nelva Nay for evaluation of chest pain, initially seen on 07/14/2019.  Cath 11/2013 at [facility] in [place] showed 60% in-stent restenosis in proximal LAD, 20% in-stent restenosis in D1, 90% stenosis in distal RCA.  S/p DES to distal RCA.  Lexiscan Myoview on 07/23/2019 showed basal to mid anterior ischemia.  Cath on 08/05/2019 showed proximal RCA 30%, mid RCA 30%, RPL 40%, patent distal RCA stent, patent RPAV stent, RPAV 60%, mid LAD 50%, 100% D1 fills by left to left collaterals, 95% ostial D2 not amenable to PCI, normal LV systolic function.  ABIs 09/01/2019 showed noncompressible vessels with normal TBI's.  At clinic appointment for 08/11/20, found to have iron deficiency anemia (hemoglobin decreased from 15.3 on 08/01/19 to 7.9 on 08/11/2020, MCV 64, ferritin 2).  He was sent to the ED and received blood transfusion.  He declined admission.  Recommended to hold Plavix, continue aspirin given his prior coronary stenting, and referred to GI.  Since last clinic visit, he reports that he is doing okay.  Planning shoulder surgery.  Went to the ER with chest pain on 08/10/2021.  Troponin 36 > 32.  He reports he continues to have baseline chest pain, that occurs 3-4 times per week, describes as twisting in the chest that last for few seconds and resolves.  He denies any dyspnea, lower extremity edema, or palpitations.  Reports some lightheadedness with standing but denies any syncope.  He has not been exercising due to pain in his feet from  neuropathy but has improved.  He can walk up flight of stairs without stopping, denies symptoms with this.  He was having hematuria, denies any further bleeding.  Reports his blood counts have improved, recently was taken off his iron supplements.  Past Medical History:  Diagnosis Date   Cirrhosis (HCC)    Coronary artery disease    Diabetes mellitus without complication (HCC)    Hypertension       Current Medications: Current Meds  Medication Sig   Apoaequorin (PREVAGEN EXTRA STRENGTH) 20 MG CAPS Take 20 tablets by mouth daily.   aspirin EC 81 MG tablet Take 81 mg by mouth at bedtime.   Buprenorphine HCl 75 MCG FILM Take 75 mcg by mouth in the morning and at bedtime.   busPIRone (BUSPAR) 10 MG tablet Take 20 mg by mouth 2 (two) times daily.    docusate sodium (COLACE) 100 MG capsule Take 100 mg by mouth daily.   docusate sodium (COLACE) 250 MG capsule Take 1 capsule (250 mg total) by mouth daily.   DORZOLAMIDE HCL-TIMOLOL MAL OP Place 1 drop into the left eye daily.   DULoxetine (CYMBALTA) 60 MG capsule Take 60 mg by mouth 2 (two) times daily.    gabapentin (NEURONTIN) 300 MG capsule Take 1,200 mg by mouth 3 (three) times daily.   HUMULIN R U-500 KWIKPEN 500 UNIT/ML kwikpen Inject 50 Units into the skin 2 (two) times daily with a meal.   hydrochlorothiazide (  HYDRODIURIL) 25 MG tablet Take 25 mg by mouth every evening.   losartan (COZAAR) 100 MG tablet Take 100 mg by mouth at bedtime.   metFORMIN (GLUCOPHAGE) 500 MG tablet Take 1,000 mg by mouth 2 (two) times daily.   metFORMIN (GLUCOPHAGE-XR) 500 MG 24 hr tablet Take 2 tablets (1,000 mg total) by mouth in the morning and at bedtime.   Multiple Vitamins-Minerals (MULTIVITAMIN WITH MINERALS) tablet Take 1 tablet by mouth daily.   Multiple Vitamins-Minerals (PRESERVISION AREDS PO) Take 1 tablet by mouth in the morning and at bedtime.   omeprazole (PRILOSEC OTC) 20 MG tablet Take 20 mg by mouth daily.   OZEMPIC, 0.25 OR 0.5 MG/DOSE, 2  MG/1.5ML SOPN Inject 0.25 mg into the skin once a week.   pregabalin (LYRICA) 225 MG capsule Take 225 mg by mouth 2 (two) times daily.    tamsulosin (FLOMAX) 0.4 MG CAPS capsule Take 0.4 mg by mouth at bedtime.   traZODone (DESYREL) 50 MG tablet Take 50 mg by mouth at bedtime.   vitamin C (ASCORBIC ACID) 500 MG tablet Take 500 mg by mouth daily.   Vitamin D, Cholecalciferol, 25 MCG (1000 UT) TABS Take 1,000 mg by mouth daily.    XIIDRA 5 % SOLN Place 1 drop into both eyes 2 (two) times daily.     Allergies:   Pseudoephedrine   Social History   Socioeconomic History   Marital status: Married    Spouse name: Not on file   Number of children: Not on file   Years of education: Not on file   Highest education level: Not on file  Occupational History   Not on file  Tobacco Use   Smoking status: Never   Smokeless tobacco: Never  Substance and Sexual Activity   Alcohol use: Not Currently   Drug use: Never   Sexual activity: Yes  Other Topics Concern   Not on file  Social History Narrative   Not on file   Social Determinants of Health   Financial Resource Strain: Not on file  Food Insecurity: Not on file  Transportation Needs: Not on file  Physical Activity: Not on file  Stress: Not on file  Social Connections: Not on file     Family History: No relevant family history  ROS:   Please see the history of present illness.    All other systems reviewed and are negative.  EKGs/Labs/Other Studies Reviewed:    The following studies were reviewed today: Lexiscan Myoview 07/23/2019: The left ventricular ejection fraction is normal (55-65%). Nuclear stress EF: 55%. There was no ST segment deviation noted during stress. No T wave inversion was noted during stress. Findings consistent with ischemia. This is an intermediate risk study.   Impression:   1. Diaphragm attenuation artifact.  2. There is a medium size (6-8%), moderate perfusion defect present in the basal to mid  anterior wall consistent with ischemia.  3. No evidence of prior infarction.  4. Normal LVEF, 55%.  5. This is an intermediate risk study given the patient's history of CAD/PCI.   Cath 08/04/29: Previously placed Dist RCA stent (unknown type) is widely patent. Prox RCA lesion is 30% stenosed. Mid RCA-1 lesion is 30% stenosed. Mid RCA-2 lesion is 35% stenosed. 1st RPL lesion is 40% stenosed. Previously placed RPAV-1 stent (unknown type) is widely patent. RPAV-2 lesion is 60% stenosed. Mid LAD lesion is 50% stenosed. 1st Diag lesion is 100% stenosed. 2nd Diag lesion is 95% stenosed. The left ventricular systolic function is  normal. LV end diastolic pressure is normal. The left ventricular ejection fraction is 55-65% by visual estimate.   1. Single vessel obstructive CAD    - Stents in proximal to mid LAD are patent with focal area of 50% narrowing.    - 100% first diagonal. Fills by left to left collaterals.    - 95% ostial small second diagonal    - stent in distal RCA is patent. 2. Normal LV function 3. Normal LVEDP   Plan: compared to last cath report from [place] in 2017 there is no significant change. Recommend continued medical therapy. Area of ischemia noted on stress testing corresponds to diagonal disease that is not suitable for PCI.     ABIs 09/01/19: Right: Resting right ankle-brachial index indicates noncompressible right  lower extremity arteries. The right toe-brachial index is normal.   Left: Resting left ankle-brachial index indicates noncompressible left  lower extremity arteries. The left toe-brachial index is normal.    EKG:   10/25/2021: Normal sinus rhythm, rate 87, nonspecific T wave flattening 08/11/20: normal sinus rhythm,  rate 74, no ST abnormalities    Recent Labs: 08/10/2021: BUN 21; Creatinine, Ser 0.96; Hemoglobin 16.7; Magnesium 2.0; Platelets 143; Potassium 3.9; Sodium 136  Recent Lipid Panel    Component Value Date/Time   CHOL 97 (L)  08/11/2020 0904   TRIG 237 (H) 08/11/2020 0904   HDL 35 (L) 08/11/2020 0904   CHOLHDL 2.8 08/11/2020 0904   LDLCALC 26 08/11/2020 0904    Physical Exam:    VS:  BP 124/72 (BP Location: Left Arm, Patient Position: Sitting, Cuff Size: Large)   Pulse 87   Ht 6\' 1"  (1.854 m)   Wt 260 lb 6.4 oz (118.1 kg)   SpO2 93%   BMI 34.36 kg/m     Wt Readings from Last 3 Encounters:  10/25/21 260 lb 6.4 oz (118.1 kg)  11/24/20 251 lb (113.9 kg)  08/11/20 253 lb 6.4 oz (114.9 kg)     GEN:  Well nourished, well developed in no acute distress HEENT: Normal NECK: No JVD CARDIAC: RRR, 2/6 systolic murmur RESPIRATORY:  Clear to auscultation without rales, wheezing or rhonchi  ABDOMEN: Soft, non-tender, non-distended MUSCULOSKELETAL:  No edema; No deformity  SKIN: Warm and dry NEUROLOGIC:  Alert and oriented x 3 PSYCHIATRIC:  Normal affect   ASSESSMENT:    1. CAD in native artery   2. Pre-op evaluation   3. Hyperlipidemia, unspecified hyperlipidemia type   4. Cardiac murmur   5. Essential hypertension     PLAN:     CAD: Status post mid LAD and distal RCA stenting.  Lexiscan Myoview showed anterior ischemia.  Cath on 08/05/2019 showed patent stents, occluded D1, 95% D2 not amenable to intervention.  Medical management recommended.   -Continue ASA 81 mg, rosuvastatin 20 mg daily.  Plavix discontinued given iron deficiency anemia -Continue Toprol-XL 100 mg daily -Continue Imdur 60 mg daily -SL NTG prn  Preop evaluation: Prior to shoulder surgery.  He reports baseline chest pain, which sounds noncardiac as describes twisting chest pain that lasts for few seconds and resolves.  Functional capacity greater than 4 METS, denies exertional chest pain.  RCRI score 2 given CAD, IDDM.  Overall would classify as intermediate risk for an intermediate risk surgery -Worsening systolic murmur on exam, had aortic sclerosis on echo 07/2019, will repeat echo to evaluate for progression to aortic stenosis.   If unremarkable, no further work-up recommended prior to surgery. -Given his history of coronary stenting,  would not hold aspirin for his surgery.  Continue aspirin 81 mg perioperatively  Iron deficiency anemia: At clinic appointment for 08/11/20, found to have iron deficiency anemia (hemoglobin decreased from 15.3 on 08/01/19 to 7.9 on 08/11/2020, MCV 64, ferritin 2).  He was sent to the ED and received blood transfusion.  He declined admission.  He was referred to GI.  Reports no recent bleeding.  Anemia has resolved  Palpitations: had planned Zio 08/2020 but he decided against wearing the monitor.  No recent palpitations.  Hypertension: appears controlled, continue hydrochlorothiazide 25 mg daily, Imdur 60 mg daily, metoprolol 100 mg daily, and losartan 100 mg  Type 2 diabetes: On insulin.  A1c 6.9 on 06/04/2019  Hyperlipidemia: On rosuvastatin 20 mg daily.  LDL 26 on 08/11/20  Leg pain: ABIs show noncompressible vessels but normal TBI's.   RTC in 6 months  Medication Adjustments/Labs and Tests Ordered: Current medicines are reviewed at length with the patient today.  Concerns regarding medicines are outlined above.  Orders Placed This Encounter  Procedures   Lipid panel   EKG 12-Lead   ECHOCARDIOGRAM COMPLETE    Meds ordered this encounter  Medications   nitroGLYCERIN (NITROSTAT) 0.4 MG SL tablet    Sig: Place 1 tablet (0.4 mg total) under the tongue every 5 (five) minutes as needed for chest pain.    Dispense:  25 tablet    Refill:  3   metoprolol succinate (TOPROL-XL) 100 MG 24 hr tablet    Sig: Take 1 tablet (100 mg total) by mouth daily. Take with or immediately following a meal.    Dispense:  90 tablet    Refill:  3     Patient Instructions  Medication Instructions:  Your physician recommends that you continue on your current medications as directed. Please refer to the Current Medication list given to you today.  *If you need a refill on your cardiac medications before  your next appointment, please call your pharmacy*   Lab Work: Lipid today  If you have labs (blood work) drawn today and your tests are completely normal, you will receive your results only by: MyChart Message (if you have MyChart) OR A paper copy in the mail If you have any lab test that is abnormal or we need to change your treatment, we will call you to review the results.   Testing/Procedures: Your physician has requested that you have an echocardiogram. Echocardiography is a painless test that uses sound waves to create images of your heart. It provides your doctor with information about the size and shape of your heart and how well your heart's chambers and valves are working. This procedure takes approximately one hour. There are no restrictions for this procedure.  This will be done at our [facility] location:  [address]  Follow-Up: At [facility], you and your health needs are our priority.  As part of our continuing mission to provide you with exceptional heart care, we have created designated Provider Care Teams.  These Care Teams include your primary Cardiologist (physician) and Advanced Practice Providers (APPs -  Physician Assistants and Nurse Practitioners) who all work together to provide you with the care you need, when you need it.  We recommend signing up for the patient portal called "MyChart".  Sign up information is provided on this After Visit Summary.  MyChart is used to connect with patients for Virtual Visits (Telemedicine).  Patients are able to view lab/test results, encounter notes, upcoming  appointments, etc.  Non-urgent messages can be sent to your provider as well.   To learn more about what you can do with MyChart, go to [email or web address].    Your next appointment:   6 month(s)  The format for your next appointment:   In Person  Provider:   Donato Heinz, MD {    Important Information About Sugar            Signed, Donato Heinz, MD  10/25/2021 6:25 PM    [place]

## 2021-10-25 ENCOUNTER — Encounter: Payer: Self-pay | Admitting: Cardiology

## 2021-10-25 ENCOUNTER — Ambulatory Visit (INDEPENDENT_AMBULATORY_CARE_PROVIDER_SITE_OTHER): Payer: No Typology Code available for payment source | Admitting: Cardiology

## 2021-10-25 VITALS — BP 124/72 | HR 87 | Ht 73.0 in | Wt 260.4 lb

## 2021-10-25 DIAGNOSIS — I2510 Atherosclerotic heart disease of native coronary artery without angina pectoris: Secondary | ICD-10-CM | POA: Diagnosis not present

## 2021-10-25 DIAGNOSIS — Z01818 Encounter for other preprocedural examination: Secondary | ICD-10-CM | POA: Diagnosis not present

## 2021-10-25 DIAGNOSIS — R011 Cardiac murmur, unspecified: Secondary | ICD-10-CM

## 2021-10-25 DIAGNOSIS — E785 Hyperlipidemia, unspecified: Secondary | ICD-10-CM

## 2021-10-25 DIAGNOSIS — I10 Essential (primary) hypertension: Secondary | ICD-10-CM

## 2021-10-25 MED ORDER — METOPROLOL SUCCINATE ER 100 MG PO TB24: 100.0000 mg | ORAL_TABLET | Freq: Every day | ORAL | 3 refills | Status: AC

## 2021-10-25 MED ORDER — NITROGLYCERIN 0.4 MG SL SUBL
0.4000 mg | SUBLINGUAL_TABLET | SUBLINGUAL | 3 refills | Status: AC | PRN
Start: 2021-10-25 — End: 2022-01-23

## 2021-10-25 NOTE — Patient Instructions (Signed)
Medication Instructions:  Your physician recommends that you continue on your current medications as directed. Please refer to the Current Medication list given to you today.  *If you need a refill on your cardiac medications before your next appointment, please call your pharmacy*   Lab Work: Lipid today  If you have labs (blood work) drawn today and your tests are completely normal, you will receive your results only by: MyChart Message (if you have MyChart) OR A paper copy in the mail If you have any lab test that is abnormal or we need to change your treatment, we will call you to review the results.   Testing/Procedures: Your physician has requested that you have an echocardiogram. Echocardiography is a painless test that uses sound waves to create images of your heart. It provides your doctor with information about the size and shape of your heart and how well your heart's chambers and valves are working. This procedure takes approximately one hour. There are no restrictions for this procedure.  This will be done at our [facility] location:  [address]  Follow-Up: At [facility], you and your health needs are our priority.  As part of our continuing mission to provide you with exceptional heart care, we have created designated Provider Care Teams.  These Care Teams include your primary Cardiologist (physician) and Advanced Practice Providers (APPs -  Physician Assistants and Nurse Practitioners) who all work together to provide you with the care you need, when you need it.  We recommend signing up for the patient portal called "MyChart".  Sign up information is provided on this After Visit Summary.  MyChart is used to connect with patients for Virtual Visits (Telemedicine).  Patients are able to view lab/test results, encounter notes, upcoming appointments, etc.  Non-urgent messages can be sent to your provider as well.   To learn more about what you can do  with MyChart, go to [email or web address].    Your next appointment:   6 month(s)  The format for your next appointment:   In Person  Provider:   Little Ishikawa, MD {    Important Information About Sugar

## 2021-10-27 NOTE — Telephone Encounter (Signed)
Patient should continue aspirin perioperatively given his history of coronary stenting

## 2021-10-31 ENCOUNTER — Encounter: Payer: Self-pay | Admitting: Cardiology

## 2021-11-02 ENCOUNTER — Ambulatory Visit (HOSPITAL_COMMUNITY): Payer: No Typology Code available for payment source | Attending: Cardiology

## 2021-11-02 ENCOUNTER — Other Ambulatory Visit: Payer: Self-pay

## 2021-11-02 DIAGNOSIS — R011 Cardiac murmur, unspecified: Secondary | ICD-10-CM | POA: Diagnosis present

## 2021-11-02 LAB — ECHOCARDIOGRAM COMPLETE
AR max vel: 1.25 cm2
AV Area VTI: 1.28 cm2
AV Area mean vel: 1.17 cm2
AV Mean grad: 19 mmHg
AV Peak grad: 33.2 mmHg
Ao pk vel: 2.88 m/s
Area-P 1/2: 3 cm2
S' Lateral: 2.5 cm

## 2021-11-03 ENCOUNTER — Other Ambulatory Visit: Payer: Self-pay | Admitting: *Deleted

## 2021-11-03 DIAGNOSIS — I350 Nonrheumatic aortic (valve) stenosis: Secondary | ICD-10-CM

## 2021-11-22 DIAGNOSIS — S31102A Unspecified open wound of abdominal wall, epigastric region without penetration into peritoneal cavity, initial encounter: Secondary | ICD-10-CM | POA: Diagnosis not present

## 2021-11-22 DIAGNOSIS — Z794 Long term (current) use of insulin: Secondary | ICD-10-CM | POA: Diagnosis not present

## 2021-11-22 DIAGNOSIS — E113499 Type 2 diabetes mellitus with severe nonproliferative diabetic retinopathy without macular edema, unspecified eye: Secondary | ICD-10-CM | POA: Diagnosis not present

## 2021-11-22 DIAGNOSIS — Z7985 Long-term (current) use of injectable non-insulin antidiabetic drugs: Secondary | ICD-10-CM | POA: Diagnosis not present

## 2021-11-22 DIAGNOSIS — E119 Type 2 diabetes mellitus without complications: Secondary | ICD-10-CM | POA: Diagnosis not present

## 2021-11-22 DIAGNOSIS — S31103A Unspecified open wound of abdominal wall, right lower quadrant without penetration into peritoneal cavity, initial encounter: Secondary | ICD-10-CM | POA: Diagnosis not present

## 2021-11-22 DIAGNOSIS — E1140 Type 2 diabetes mellitus with diabetic neuropathy, unspecified: Secondary | ICD-10-CM | POA: Diagnosis not present

## 2021-11-28 DIAGNOSIS — M19071 Primary osteoarthritis, right ankle and foot: Secondary | ICD-10-CM | POA: Diagnosis not present

## 2021-11-28 DIAGNOSIS — M205X2 Other deformities of toe(s) (acquired), left foot: Secondary | ICD-10-CM | POA: Diagnosis not present

## 2021-11-28 DIAGNOSIS — E1142 Type 2 diabetes mellitus with diabetic polyneuropathy: Secondary | ICD-10-CM | POA: Diagnosis not present

## 2021-11-28 DIAGNOSIS — M19072 Primary osteoarthritis, left ankle and foot: Secondary | ICD-10-CM | POA: Diagnosis not present

## 2021-11-29 ENCOUNTER — Telehealth: Payer: Self-pay | Admitting: Orthopaedic Surgery

## 2021-11-29 NOTE — Telephone Encounter (Signed)
Pt called requesting an extension of care been sent to [facility] for pt to get cortisone injection. Pt canceled appt until [facility] approve more visits. Please call pt when approved and set appt. Pt phone number is [phone].

## 2021-11-30 NOTE — Telephone Encounter (Signed)
[facility] auth sent

## 2021-12-07 ENCOUNTER — Ambulatory Visit: Payer: No Typology Code available for payment source | Admitting: Orthopaedic Surgery

## 2021-12-07 DIAGNOSIS — Z7985 Long-term (current) use of injectable non-insulin antidiabetic drugs: Secondary | ICD-10-CM | POA: Diagnosis not present

## 2021-12-07 DIAGNOSIS — L98499 Non-pressure chronic ulcer of skin of other sites with unspecified severity: Secondary | ICD-10-CM | POA: Diagnosis not present

## 2021-12-07 DIAGNOSIS — Z794 Long term (current) use of insulin: Secondary | ICD-10-CM | POA: Diagnosis not present

## 2021-12-12 DIAGNOSIS — M47812 Spondylosis without myelopathy or radiculopathy, cervical region: Secondary | ICD-10-CM | POA: Diagnosis not present

## 2021-12-12 DIAGNOSIS — M47892 Other spondylosis, cervical region: Secondary | ICD-10-CM | POA: Diagnosis not present

## 2021-12-20 DIAGNOSIS — E1140 Type 2 diabetes mellitus with diabetic neuropathy, unspecified: Secondary | ICD-10-CM | POA: Diagnosis not present

## 2021-12-20 DIAGNOSIS — I10 Essential (primary) hypertension: Secondary | ICD-10-CM | POA: Diagnosis not present

## 2021-12-20 DIAGNOSIS — G8929 Other chronic pain: Secondary | ICD-10-CM | POA: Diagnosis not present

## 2021-12-20 DIAGNOSIS — G894 Chronic pain syndrome: Secondary | ICD-10-CM | POA: Diagnosis not present

## 2021-12-20 DIAGNOSIS — M542 Cervicalgia: Secondary | ICD-10-CM | POA: Diagnosis not present

## 2021-12-20 DIAGNOSIS — M25511 Pain in right shoulder: Secondary | ICD-10-CM | POA: Diagnosis not present

## 2021-12-20 DIAGNOSIS — K5903 Drug induced constipation: Secondary | ICD-10-CM | POA: Diagnosis not present

## 2021-12-20 DIAGNOSIS — I2510 Atherosclerotic heart disease of native coronary artery without angina pectoris: Secondary | ICD-10-CM | POA: Diagnosis not present

## 2021-12-29 DIAGNOSIS — E113492 Type 2 diabetes mellitus with severe nonproliferative diabetic retinopathy without macular edema, left eye: Secondary | ICD-10-CM | POA: Diagnosis not present

## 2021-12-29 DIAGNOSIS — Z961 Presence of intraocular lens: Secondary | ICD-10-CM | POA: Diagnosis not present

## 2021-12-29 DIAGNOSIS — E113591 Type 2 diabetes mellitus with proliferative diabetic retinopathy without macular edema, right eye: Secondary | ICD-10-CM | POA: Diagnosis not present

## 2022-01-04 ENCOUNTER — Ambulatory Visit: Payer: No Typology Code available for payment source | Admitting: Orthopaedic Surgery

## 2022-01-16 DIAGNOSIS — F1090 Alcohol use, unspecified, uncomplicated: Secondary | ICD-10-CM | POA: Diagnosis not present

## 2022-01-16 DIAGNOSIS — K7460 Unspecified cirrhosis of liver: Secondary | ICD-10-CM | POA: Diagnosis not present

## 2022-01-23 ENCOUNTER — Ambulatory Visit: Payer: Self-pay | Admitting: Orthopaedic Surgery

## 2022-01-24 DIAGNOSIS — H04122 Dry eye syndrome of left lacrimal gland: Secondary | ICD-10-CM | POA: Diagnosis not present

## 2022-01-24 DIAGNOSIS — E113213 Type 2 diabetes mellitus with mild nonproliferative diabetic retinopathy with macular edema, bilateral: Secondary | ICD-10-CM | POA: Diagnosis not present

## 2022-01-24 DIAGNOSIS — H4922 Sixth [abducent] nerve palsy, left eye: Secondary | ICD-10-CM | POA: Diagnosis not present

## 2022-01-24 DIAGNOSIS — H524 Presbyopia: Secondary | ICD-10-CM | POA: Diagnosis not present

## 2022-02-03 DIAGNOSIS — F339 Major depressive disorder, recurrent, unspecified: Secondary | ICD-10-CM | POA: Diagnosis not present

## 2022-02-06 DIAGNOSIS — E113511 Type 2 diabetes mellitus with proliferative diabetic retinopathy with macular edema, right eye: Secondary | ICD-10-CM | POA: Diagnosis not present

## 2022-02-06 DIAGNOSIS — E113412 Type 2 diabetes mellitus with severe nonproliferative diabetic retinopathy with macular edema, left eye: Secondary | ICD-10-CM | POA: Diagnosis not present

## 2022-02-06 DIAGNOSIS — Z961 Presence of intraocular lens: Secondary | ICD-10-CM | POA: Diagnosis not present

## 2022-03-16 DIAGNOSIS — K810 Acute cholecystitis: Secondary | ICD-10-CM | POA: Diagnosis not present

## 2022-03-16 DIAGNOSIS — R9431 Abnormal electrocardiogram [ECG] [EKG]: Secondary | ICD-10-CM | POA: Diagnosis not present

## 2022-03-17 DIAGNOSIS — E669 Obesity, unspecified: Secondary | ICD-10-CM | POA: Diagnosis not present

## 2022-03-17 DIAGNOSIS — G8929 Other chronic pain: Secondary | ICD-10-CM | POA: Diagnosis not present

## 2022-03-17 DIAGNOSIS — E1142 Type 2 diabetes mellitus with diabetic polyneuropathy: Secondary | ICD-10-CM | POA: Diagnosis not present

## 2022-03-17 DIAGNOSIS — Z794 Long term (current) use of insulin: Secondary | ICD-10-CM | POA: Diagnosis not present

## 2022-03-17 DIAGNOSIS — R1011 Right upper quadrant pain: Secondary | ICD-10-CM | POA: Diagnosis not present

## 2022-03-17 DIAGNOSIS — E88819 Insulin resistance, unspecified: Secondary | ICD-10-CM | POA: Diagnosis not present

## 2022-03-17 DIAGNOSIS — G8918 Other acute postprocedural pain: Secondary | ICD-10-CM | POA: Diagnosis not present

## 2022-03-17 DIAGNOSIS — E1165 Type 2 diabetes mellitus with hyperglycemia: Secondary | ICD-10-CM | POA: Diagnosis not present

## 2022-03-17 DIAGNOSIS — K812 Acute cholecystitis with chronic cholecystitis: Secondary | ICD-10-CM | POA: Diagnosis not present

## 2022-03-17 DIAGNOSIS — M25511 Pain in right shoulder: Secondary | ICD-10-CM | POA: Diagnosis not present

## 2022-03-17 DIAGNOSIS — E1169 Type 2 diabetes mellitus with other specified complication: Secondary | ICD-10-CM | POA: Diagnosis not present

## 2022-03-17 DIAGNOSIS — Z6832 Body mass index (BMI) 32.0-32.9, adult: Secondary | ICD-10-CM | POA: Diagnosis not present

## 2022-04-06 DIAGNOSIS — R633 Feeding difficulties, unspecified: Secondary | ICD-10-CM | POA: Diagnosis not present

## 2022-04-06 DIAGNOSIS — R1312 Dysphagia, oropharyngeal phase: Secondary | ICD-10-CM | POA: Diagnosis not present

## 2022-04-14 ENCOUNTER — Inpatient Hospital Stay
Admission: RE | Admit: 2022-04-14 | Discharge: 2022-05-31 | Disposition: A | Discharge status: Skilled Nursing Facility | Payer: Self-pay

## 2022-04-14 ENCOUNTER — Other Ambulatory Visit (HOSPITAL_COMMUNITY): Payer: Self-pay

## 2022-04-14 ENCOUNTER — Other Ambulatory Visit (HOSPITAL_COMMUNITY): Payer: No Typology Code available for payment source

## 2022-04-15 ENCOUNTER — Other Ambulatory Visit (HOSPITAL_COMMUNITY): Payer: Self-pay

## 2022-04-15 LAB — BASIC METABOLIC PANEL
Anion gap: 7 (ref 5–15)
BUN: 32 mg/dL — ABNORMAL HIGH (ref 8–23)
CO2: 22 mmol/L (ref 22–32)
Calcium: 7.9 mg/dL — ABNORMAL LOW (ref 8.9–10.3)
Chloride: 113 mmol/L — ABNORMAL HIGH (ref 98–111)
Creatinine, Ser: 0.45 mg/dL — ABNORMAL LOW (ref 0.61–1.24)
GFR, Estimated: >60 mL/min (ref >60)
Glucose, Bld: 128 mg/dL — ABNORMAL HIGH (ref 70–99)
Potassium: 3.4 mmol/L — ABNORMAL LOW (ref 3.5–5.1)
Sodium: 142 mmol/L (ref 135–145)

## 2022-04-15 LAB — CBC
HCT: 34.2 % — ABNORMAL LOW (ref 39.0–52.0)
Hemoglobin: 9.9 g/dL — ABNORMAL LOW (ref 13.0–17.0)
MCH: 27 pg (ref 26.0–34.0)
MCHC: 28.9 g/dL — ABNORMAL LOW (ref 30.0–36.0)
MCV: 93.2 fL (ref 80.0–100.0)
Platelets: 113 10*3/uL — ABNORMAL LOW (ref 150–400)
RBC: 3.67 MIL/uL — ABNORMAL LOW (ref 4.22–5.81)
RDW: 24.2 % — ABNORMAL HIGH (ref 11.5–15.5)
WBC: 9.9 10*3/uL (ref 4.0–10.5)
nRBC: 0.5 % — ABNORMAL HIGH (ref 0.0–0.2)

## 2022-04-15 LAB — CLOSTRIDIUM DIFFICILE BY PCR, REFLEXED: Toxigenic C. Difficile by PCR: NEGATIVE

## 2022-04-16 LAB — POTASSIUM: Potassium: 4.2 mmol/L (ref 3.5–5.1)

## 2022-04-17 LAB — BASIC METABOLIC PANEL
Anion gap: 9 (ref 5–15)
BUN: 39 mg/dL — ABNORMAL HIGH (ref 8–23)
CO2: 22 mmol/L (ref 22–32)
Calcium: 8.1 mg/dL — ABNORMAL LOW (ref 8.9–10.3)
Chloride: 114 mmol/L — ABNORMAL HIGH (ref 98–111)
Creatinine, Ser: 0.37 mg/dL — ABNORMAL LOW (ref 0.61–1.24)
GFR, Estimated: >60 mL/min (ref >60)
Glucose, Bld: 128 mg/dL — ABNORMAL HIGH (ref 70–99)
Potassium: 3.9 mmol/L (ref 3.5–5.1)
Sodium: 145 mmol/L (ref 135–145)

## 2022-04-17 LAB — CBC
HCT: 32.6 % — ABNORMAL LOW (ref 39.0–52.0)
Hemoglobin: 9.4 g/dL — ABNORMAL LOW (ref 13.0–17.0)
MCH: 26.9 pg (ref 26.0–34.0)
MCHC: 28.8 g/dL — ABNORMAL LOW (ref 30.0–36.0)
MCV: 93.4 fL (ref 80.0–100.0)
Platelets: 106 10*3/uL — ABNORMAL LOW (ref 150–400)
RBC: 3.49 MIL/uL — ABNORMAL LOW (ref 4.22–5.81)
RDW: 24.5 % — ABNORMAL HIGH (ref 11.5–15.5)
WBC: 8 10*3/uL (ref 4.0–10.5)
nRBC: 0.2 % (ref 0.0–0.2)

## 2022-04-17 LAB — AMMONIA: Ammonia: 42 umol/L — ABNORMAL HIGH (ref 9–35)

## 2022-04-17 LAB — HEMOGLOBIN A1C
Hgb A1c MFr Bld: 6.8 % — ABNORMAL HIGH (ref 4.8–5.6)
Mean Plasma Glucose: 148 mg/dL

## 2022-04-18 ENCOUNTER — Other Ambulatory Visit (HOSPITAL_COMMUNITY): Payer: Self-pay

## 2022-04-18 LAB — CBC
HCT: 31.5 % — ABNORMAL LOW (ref 39.0–52.0)
HCT: 32.7 % — ABNORMAL LOW (ref 39.0–52.0)
Hemoglobin: 9.1 g/dL — ABNORMAL LOW (ref 13.0–17.0)
Hemoglobin: 9.1 g/dL — ABNORMAL LOW (ref 13.0–17.0)
MCH: 26.8 pg (ref 26.0–34.0)
MCH: 26.2 pg (ref 26.0–34.0)
MCHC: 28.9 g/dL — ABNORMAL LOW (ref 30.0–36.0)
MCHC: 27.8 g/dL — ABNORMAL LOW (ref 30.0–36.0)
MCV: 92.9 fL (ref 80.0–100.0)
MCV: 94.2 fL (ref 80.0–100.0)
Platelets: 148 10*3/uL — ABNORMAL LOW (ref 150–400)
Platelets: 219 10*3/uL (ref 150–400)
RBC: 3.39 MIL/uL — ABNORMAL LOW (ref 4.22–5.81)
RBC: 3.47 MIL/uL — ABNORMAL LOW (ref 4.22–5.81)
RDW: 24.1 % — ABNORMAL HIGH (ref 11.5–15.5)
RDW: 23.9 % — ABNORMAL HIGH (ref 11.5–15.5)
WBC: 9 10*3/uL (ref 4.0–10.5)
WBC: 9.8 10*3/uL (ref 4.0–10.5)
nRBC: 0.4 % — ABNORMAL HIGH (ref 0.0–0.2)
nRBC: 0.4 % — ABNORMAL HIGH (ref 0.0–0.2)

## 2022-04-18 LAB — COMPREHENSIVE METABOLIC PANEL
ALT: 46 U/L — ABNORMAL HIGH (ref 0–44)
AST: 49 U/L — ABNORMAL HIGH (ref 15–41)
Albumin: 1.6 g/dL — ABNORMAL LOW (ref 3.5–5.0)
Alkaline Phosphatase: 571 U/L — ABNORMAL HIGH (ref 38–126)
Anion gap: 5 (ref 5–15)
BUN: 33 mg/dL — ABNORMAL HIGH (ref 8–23)
CO2: 25 mmol/L (ref 22–32)
Calcium: 8.2 mg/dL — ABNORMAL LOW (ref 8.9–10.3)
Chloride: 118 mmol/L — ABNORMAL HIGH (ref 98–111)
Creatinine, Ser: 0.45 mg/dL — ABNORMAL LOW (ref 0.61–1.24)
GFR, Estimated: >60 mL/min (ref >60)
Glucose, Bld: 161 mg/dL — ABNORMAL HIGH (ref 70–99)
Potassium: 4.5 mmol/L (ref 3.5–5.1)
Sodium: 148 mmol/L — ABNORMAL HIGH (ref 135–145)
Total Bilirubin: 0.7 mg/dL (ref 0.3–1.2)
Total Protein: 5 g/dL — ABNORMAL LOW (ref 6.5–8.1)

## 2022-04-18 LAB — PROTIME-INR
INR: 1.4 — ABNORMAL HIGH (ref 0.8–1.2)
Prothrombin Time: 16.7 seconds — ABNORMAL HIGH (ref 11.4–15.2)

## 2022-04-19 ENCOUNTER — Other Ambulatory Visit (HOSPITAL_COMMUNITY): Payer: Self-pay

## 2022-04-19 LAB — CBC
HCT: 30.9 % — ABNORMAL LOW (ref 39.0–52.0)
Hemoglobin: 8.7 g/dL — ABNORMAL LOW (ref 13.0–17.0)
MCH: 26.3 pg (ref 26.0–34.0)
MCHC: 28.2 g/dL — ABNORMAL LOW (ref 30.0–36.0)
MCV: 93.4 fL (ref 80.0–100.0)
Platelets: 151 10*3/uL (ref 150–400)
RBC: 3.31 MIL/uL — ABNORMAL LOW (ref 4.22–5.81)
RDW: 23.4 % — ABNORMAL HIGH (ref 11.5–15.5)
WBC: 8.5 10*3/uL (ref 4.0–10.5)
nRBC: 0.2 % (ref 0.0–0.2)

## 2022-04-19 LAB — PROTIME-INR
INR: 1.4 — ABNORMAL HIGH (ref 0.8–1.2)
Prothrombin Time: 16.6 seconds — ABNORMAL HIGH (ref 11.4–15.2)

## 2022-04-19 LAB — SODIUM: Sodium: 147 mmol/L — ABNORMAL HIGH (ref 135–145)

## 2022-04-19 MED ORDER — IOHEXOL 350 MG/ML SOLN
75.0000 mL | Freq: Once | INTRAVENOUS | Status: AC | PRN
  Administered 2022-04-19: 75 mL via INTRAVENOUS

## 2022-04-21 ENCOUNTER — Other Ambulatory Visit (HOSPITAL_COMMUNITY): Payer: Self-pay

## 2022-04-21 LAB — BASIC METABOLIC PANEL
Anion gap: 4 — ABNORMAL LOW (ref 5–15)
BUN: 34 mg/dL — ABNORMAL HIGH (ref 8–23)
CO2: 24 mmol/L (ref 22–32)
Calcium: 8.4 mg/dL — ABNORMAL LOW (ref 8.9–10.3)
Chloride: 118 mmol/L — ABNORMAL HIGH (ref 98–111)
Creatinine, Ser: 0.53 mg/dL — ABNORMAL LOW (ref 0.61–1.24)
GFR, Estimated: >60 mL/min (ref >60)
Glucose, Bld: 219 mg/dL — ABNORMAL HIGH (ref 70–99)
Potassium: 4.5 mmol/L (ref 3.5–5.1)
Sodium: 146 mmol/L — ABNORMAL HIGH (ref 135–145)

## 2022-04-21 LAB — CBC
HCT: 28.9 % — ABNORMAL LOW (ref 39.0–52.0)
Hemoglobin: 8.2 g/dL — ABNORMAL LOW (ref 13.0–17.0)
MCH: 26.5 pg (ref 26.0–34.0)
MCHC: 28.4 g/dL — ABNORMAL LOW (ref 30.0–36.0)
MCV: 93.2 fL (ref 80.0–100.0)
Platelets: 130 10*3/uL — ABNORMAL LOW (ref 150–400)
RBC: 3.1 MIL/uL — ABNORMAL LOW (ref 4.22–5.81)
RDW: 22.5 % — ABNORMAL HIGH (ref 11.5–15.5)
WBC: 6.9 10*3/uL (ref 4.0–10.5)
nRBC: 0.6 % — ABNORMAL HIGH (ref 0.0–0.2)

## 2022-04-21 LAB — MAGNESIUM: Magnesium: 2.1 mg/dL (ref 1.7–2.4)

## 2022-04-22 NOTE — Consult Note (Incomplete)
Infectious Disease Consultation   Corey Silva  [id number]  DOB: 1960/10/24  DOA: 04/14/2022  Requesting physician: Dr. Owens Shark  Reason for consultation: Antibiotic Recommendations   History of Present Illness: Corey Silva is an 61 y.o. male    Review of Systems:  ROS As per HPI otherwise 10 point review of systems negative.   Past Medical History: Past Medical History:  Diagnosis Date   Cirrhosis ([place])    Coronary artery disease    Diabetes mellitus without complication ([place])    Hypertension     Past Surgical History: Past Surgical History:  Procedure Laterality Date   LEFT HEART CATH AND CORONARY ANGIOGRAPHY N/A 08/05/2019   Procedure: LEFT HEART CATH AND CORONARY ANGIOGRAPHY;  Surgeon: Martinique, Peter M, MD;  Location: [facility];  Service: Cardiovascular;  Laterality: N/A;     Allergies:   Allergies  Allergen Reactions   Pseudoephedrine Swelling     Social History:  reports that he has never smoked. He has never used smokeless tobacco. He reports that he does not currently use alcohol. He reports that he does not use drugs.   Family History: No family history on file.    Physical Exam: There were no vitals filed for this visit.  Constitutional: Appearance,  Alert and awake, oriented x3, not in any acute distress. Eyes: PERLA, EOMI, irises appear normal, anicteric sclera,  ENMT: external ears and nose appear normal, normal hearing or hard of hearing            Lips appears normal, oropharynx mucosa, tongue, posterior pharynx appear normal  Neck: neck appears normal, no masses, normal ROM, no thyromegaly, no JVD  CVS: S1-S2 clear, no murmur rubs or gallops, no LE edema, normal pedal pulses  Respiratory:  clear to auscultation bilaterally, no wheezing, rales or rhonchi. Respiratory effort normal. No accessory muscle use.  Abdomen: soft nontender, nondistended, normal bowel sounds, no hepatosplenomegaly, no hernias  Musculoskeletal: : no  cyanosis, clubbing or edema noted bilaterally                       Joint/bones/muscle exam, strength, contractures or atrophy Neuro: Cranial nerves II-XII intact, strength, sensation, reflexes Psych: judgement and insight appear normal, stable mood and affect, mental status Skin: no rashes or lesions or ulcers, no induration or nodules  Data reviewed:  I have personally reviewed following labs and imaging studies Labs:  CBC: Recent Labs  Lab 04/17/22 0144 04/18/22 0406 04/18/22 1817 04/19/22 0157 04/21/22 0730  WBC 8.0 9.0 9.8 8.5 6.9  HGB 9.4* 9.1* 9.1* 8.7* 8.2*  HCT 32.6* 31.5* 32.7* 30.9* 28.9*  MCV 93.4 92.9 94.2 93.4 93.2  PLT 106* 148* 219 151 130*    Basic Metabolic Panel: Recent Labs  Lab 04/17/22 0144 04/18/22 0406 04/19/22 0157 04/21/22 0730  NA 145 148* 147* 146*  K 3.9 4.5  --  4.5  CL 114* 118*  --  118*  CO2 22 25  --  24  GLUCOSE 128* 161*  --  219*  BUN 39* 33*  --  34*  CREATININE 0.37* 0.45*  --  0.53*  CALCIUM 8.1* 8.2*  --  8.4*  MG  --   --   --  2.1   GFR CrCl cannot be calculated (Unknown ideal weight.). Liver Function Tests: Recent Labs  Lab 04/18/22 0406  AST 49*  ALT 46*  ALKPHOS 571*  BILITOT 0.7  PROT  5.0*  ALBUMIN 1.6*   No results for input(s): "LIPASE", "AMYLASE" in the last 168 hours. Recent Labs  Lab 04/17/22 0144  AMMONIA 42*   Coagulation profile Recent Labs  Lab 04/18/22 1817 04/19/22 0157  INR 1.4* 1.4*    Cardiac Enzymes: No results for input(s): "CKTOTAL", "CKMB", "CKMBINDEX", "TROPONINI" in the last 168 hours. BNP: Invalid input(s): "POCBNP" CBG: No results for input(s): "GLUCAP" in the last 168 hours. D-Dimer No results for input(s): "DDIMER" in the last 72 hours. Hgb A1c No results for input(s): "HGBA1C" in the last 72 hours. Lipid Profile No results for input(s): "CHOL", "HDL", "LDLCALC", "TRIG", "CHOLHDL", "LDLDIRECT" in the last 72 hours. Thyroid function studies No results for input(s):  "TSH", "T4TOTAL", "T3FREE", "THYROIDAB" in the last 72 hours.  Invalid input(s): "FREET3" Anemia work up No results for input(s): "VITAMINB12", "FOLATE", "FERRITIN", "TIBC", "IRON", "RETICCTPCT" in the last 72 hours. Urinalysis    Component Value Date/Time   COLORURINE AMBER (A) 08/10/2021 1510   APPEARANCEUR HAZY (A) 08/10/2021 1510   LABSPEC 1.025 08/10/2021 1510   PHURINE 5.0 08/10/2021 1510   GLUCOSEU 50 (A) 08/10/2021 1510   HGBUR NEGATIVE 08/10/2021 1510   BILIRUBINUR NEGATIVE 08/10/2021 1510   KETONESUR NEGATIVE 08/10/2021 1510   PROTEINUR NEGATIVE 08/10/2021 1510   NITRITE NEGATIVE 08/10/2021 1510   LEUKOCYTESUR NEGATIVE 08/10/2021 1510     Sepsis Labs Recent Labs  Lab 04/18/22 0406 04/18/22 1817 04/19/22 0157 04/21/22 0730  WBC 9.0 9.8 8.5 6.9   Microbiology Recent Results (from the past 240 hour(s))  C. Diff by PCR, Reflexed     Status: None   Collection Time: 04/14/22  8:00 PM  Result Value Ref Range Status   Toxigenic C. Difficile by PCR NEGATIVE NEGATIVE Final    Comment: Patient is colonized with non toxigenic C. difficile. May not need treatment unless significant symptoms are present. Performed at [facility], [place] [address] [place], [place] [address]   Culture, blood (Routine X 2) w Reflex to ID Panel     Status: None (Preliminary result)   Collection Time: 04/18/22  3:08 PM   Specimen: BLOOD LEFT HAND  Result Value Ref Range Status   Specimen Description BLOOD LEFT HAND  Final   Special Requests   Final    BOTTLES DRAWN AEROBIC AND ANAEROBIC Blood Culture adequate volume   Culture   Final    NO GROWTH 4 DAYS Performed at [facility], [place] [address], [place], [place] [address]    Report Status PENDING  Incomplete  Culture, blood (Routine X 2) w Reflex to ID Panel     Status: None (Preliminary result)   Collection Time: 04/18/22  3:12 PM   Specimen: BLOOD RIGHT ARM  Result Value Ref Range Status   Specimen Description BLOOD RIGHT  ARM  Final   Special Requests   Final    BOTTLES DRAWN AEROBIC AND ANAEROBIC Blood Culture adequate volume   Culture   Final    NO GROWTH 4 DAYS Performed at [facility], [place] [address] [place], [place] [address]    Report Status PENDING  Incomplete       Inpatient Medications:   Scheduled Meds: Continuous Infusions:   Radiological Exams on Admission: DG Abd 1 View  Result Date: 04/21/2022 CLINICAL DATA:  Nasogastric tube placement. EXAM: ABDOMEN - 1 VIEW COMPARISON:  One-view abdomen 04/15/2022 FINDINGS: Tip of a small bore feeding tube is in the proximal stomach. Left pleural effusion and basilar  airspace disease noted. IMPRESSION: Tip of a small bore feeding tube is in the proximal stomach. Electronically Signed   By: Marin Roberts M.D.   On: 04/21/2022 16:04    Impression/Recommendations Active Problems:   * No active hospital problems. *     Thank you for this consultation.   Vonzella Nipple M.D. 04/22/2022, 7:00 PM

## 2022-04-23 LAB — CULTURE, BLOOD (ROUTINE X 2)
Culture: NO GROWTH
Culture: NO GROWTH
Special Requests: ADEQUATE
Special Requests: ADEQUATE

## 2022-04-24 LAB — BASIC METABOLIC PANEL
Anion gap: 4 — ABNORMAL LOW (ref 5–15)
BUN: 33 mg/dL — ABNORMAL HIGH (ref 8–23)
CO2: 23 mmol/L (ref 22–32)
Calcium: 8.4 mg/dL — ABNORMAL LOW (ref 8.9–10.3)
Chloride: 116 mmol/L — ABNORMAL HIGH (ref 98–111)
Creatinine, Ser: 0.44 mg/dL — ABNORMAL LOW (ref 0.61–1.24)
GFR, Estimated: >60 mL/min (ref >60)
Glucose, Bld: 261 mg/dL — ABNORMAL HIGH (ref 70–99)
Potassium: 4.6 mmol/L (ref 3.5–5.1)
Sodium: 143 mmol/L (ref 135–145)

## 2022-04-24 LAB — CBC
HCT: 28.5 % — ABNORMAL LOW (ref 39.0–52.0)
Hemoglobin: 8.3 g/dL — ABNORMAL LOW (ref 13.0–17.0)
MCH: 26.5 pg (ref 26.0–34.0)
MCHC: 29.1 g/dL — ABNORMAL LOW (ref 30.0–36.0)
MCV: 91.1 fL (ref 80.0–100.0)
Platelets: 136 10*3/uL — ABNORMAL LOW (ref 150–400)
RBC: 3.13 MIL/uL — ABNORMAL LOW (ref 4.22–5.81)
RDW: 21.5 % — ABNORMAL HIGH (ref 11.5–15.5)
WBC: 7.8 10*3/uL (ref 4.0–10.5)
nRBC: 1.7 % — ABNORMAL HIGH (ref 0.0–0.2)

## 2022-04-26 ENCOUNTER — Ambulatory Visit: Payer: No Typology Code available for payment source | Admitting: Cardiology

## 2022-04-26 NOTE — Progress Notes (Deleted)
Cardiology Office Note:    Date:  10/25/2021   ID:  Corey Silva, DOB 1961-03-13, MRN [id number]  PCP:  Erick Alley, MD  Cardiologist:  Little Ishikawa, MD  Electrophysiologist:  None   Referring MD: Erick Alley, MD   Chief Complaint  Patient presents with   Coronary Artery Disease    History of Present Illness:    Corey Silva is a 61 y.o. male with a hx of CAD, cirrhosis, type 2 diabetes, hypertension,hyperlipidemia who presents for follow-up.  He was referred by Dr. Nelva Nay for evaluation of chest pain, initially seen on 07/14/2019.  Cath 11/2013 at [facility] in [place] showed 60% in-stent restenosis in proximal LAD, 20% in-stent restenosis in D1, 90% stenosis in distal RCA.  S/p DES to distal RCA.  Lexiscan Myoview on 07/23/2019 showed basal to mid anterior ischemia.  Cath on 08/05/2019 showed proximal RCA 30%, mid RCA 30%, RPL 40%, patent distal RCA stent, patent RPAV stent, RPAV 60%, mid LAD 50%, 100% D1 fills by left to left collaterals, 95% ostial D2 not amenable to PCI, normal LV systolic function.  ABIs 09/01/2019 showed noncompressible vessels with normal TBI's.  At clinic appointment for 08/11/20, found to have iron deficiency anemia (hemoglobin decreased from 15.3 on 08/01/19 to 7.9 on 08/11/2020, MCV 64, ferritin 2).  He was sent to the ED and received blood transfusion.  He declined admission.  Recommended to hold Plavix, continue aspirin given his prior coronary stenting, and referred to GI.  Since last clinic visit,  he reports that he is doing okay.  Planning shoulder surgery.  Went to the ER with chest pain on 08/10/2021.  Troponin 36 > 32.  He reports he continues to have baseline chest pain, that occurs 3-4 times per week, describes as twisting in the chest that last for few seconds and resolves.  He denies any dyspnea, lower extremity edema, or palpitations.  Reports some lightheadedness with standing but denies any syncope.  He has not been exercising due to pain in his feet from  neuropathy but has improved.  He can walk up flight of stairs without stopping, denies symptoms with this.  He was having hematuria, denies any further bleeding.  Reports his blood counts have improved, recently was taken off his iron supplements.  Past Medical History:  Diagnosis Date   Cirrhosis (HCC)    Coronary artery disease    Diabetes mellitus without complication (HCC)    Hypertension       Current Medications: Current Meds  Medication Sig   Apoaequorin (PREVAGEN EXTRA STRENGTH) 20 MG CAPS Take 20 tablets by mouth daily.   aspirin EC 81 MG tablet Take 81 mg by mouth at bedtime.   Buprenorphine HCl 75 MCG FILM Take 75 mcg by mouth in the morning and at bedtime.   busPIRone (BUSPAR) 10 MG tablet Take 20 mg by mouth 2 (two) times daily.    docusate sodium (COLACE) 100 MG capsule Take 100 mg by mouth daily.   docusate sodium (COLACE) 250 MG capsule Take 1 capsule (250 mg total) by mouth daily.   DORZOLAMIDE HCL-TIMOLOL MAL OP Place 1 drop into the left eye daily.   DULoxetine (CYMBALTA) 60 MG capsule Take 60 mg by mouth 2 (two) times daily.    gabapentin (NEURONTIN) 300 MG capsule Take 1,200 mg by mouth 3 (three) times daily.   HUMULIN R U-500 KWIKPEN 500 UNIT/ML kwikpen Inject 50 Units into the skin 2 (two) times daily with a meal.  hydrochlorothiazide (HYDRODIURIL) 25 MG tablet Take 25 mg by mouth every evening.   losartan (COZAAR) 100 MG tablet Take 100 mg by mouth at bedtime.   metFORMIN (GLUCOPHAGE) 500 MG tablet Take 1,000 mg by mouth 2 (two) times daily.   metFORMIN (GLUCOPHAGE-XR) 500 MG 24 hr tablet Take 2 tablets (1,000 mg total) by mouth in the morning and at bedtime.   Multiple Vitamins-Minerals (MULTIVITAMIN WITH MINERALS) tablet Take 1 tablet by mouth daily.   Multiple Vitamins-Minerals (PRESERVISION AREDS PO) Take 1 tablet by mouth in the morning and at bedtime.   omeprazole (PRILOSEC OTC) 20 MG tablet Take 20 mg by mouth daily.   OZEMPIC, 0.25 OR 0.5 MG/DOSE, 2  MG/1.5ML SOPN Inject 0.25 mg into the skin once a week.   pregabalin (LYRICA) 225 MG capsule Take 225 mg by mouth 2 (two) times daily.    tamsulosin (FLOMAX) 0.4 MG CAPS capsule Take 0.4 mg by mouth at bedtime.   traZODone (DESYREL) 50 MG tablet Take 50 mg by mouth at bedtime.   vitamin C (ASCORBIC ACID) 500 MG tablet Take 500 mg by mouth daily.   Vitamin D, Cholecalciferol, 25 MCG (1000 UT) TABS Take 1,000 mg by mouth daily.    XIIDRA 5 % SOLN Place 1 drop into both eyes 2 (two) times daily.     Allergies:   Pseudoephedrine   Social History   Socioeconomic History   Marital status: Married    Spouse name: Not on file   Number of children: Not on file   Years of education: Not on file   Highest education level: Not on file  Occupational History   Not on file  Tobacco Use   Smoking status: Never   Smokeless tobacco: Never  Substance and Sexual Activity   Alcohol use: Not Currently   Drug use: Never   Sexual activity: Yes  Other Topics Concern   Not on file  Social History Narrative   Not on file   Social Determinants of Health   Financial Resource Strain: Not on file  Food Insecurity: Not on file  Transportation Needs: Not on file  Physical Activity: Not on file  Stress: Not on file  Social Connections: Not on file     Family History: No relevant family history  ROS:   Please see the history of present illness.    All other systems reviewed and are negative.  EKGs/Labs/Other Studies Reviewed:    The following studies were reviewed today: Lexiscan Myoview 07/23/2019: The left ventricular ejection fraction is normal (55-65%). Nuclear stress EF: 55%. There was no ST segment deviation noted during stress. No T wave inversion was noted during stress. Findings consistent with ischemia. This is an intermediate risk study.   Impression:   1. Diaphragm attenuation artifact.  2. There is a medium size (6-8%), moderate perfusion defect present in the basal to mid  anterior wall consistent with ischemia.  3. No evidence of prior infarction.  4. Normal LVEF, 55%.  5. This is an intermediate risk study given the patient's history of CAD/PCI.   Cath 08/04/29: Previously placed Dist RCA stent (unknown type) is widely patent. Prox RCA lesion is 30% stenosed. Mid RCA-1 lesion is 30% stenosed. Mid RCA-2 lesion is 35% stenosed. 1st RPL lesion is 40% stenosed. Previously placed RPAV-1 stent (unknown type) is widely patent. RPAV-2 lesion is 60% stenosed. Mid LAD lesion is 50% stenosed. 1st Diag lesion is 100% stenosed. 2nd Diag lesion is 95% stenosed. The left ventricular systolic function  is normal. LV end diastolic pressure is normal. The left ventricular ejection fraction is 55-65% by visual estimate.   1. Single vessel obstructive CAD    - Stents in proximal to mid LAD are patent with focal area of 50% narrowing.    - 100% first diagonal. Fills by left to left collaterals.    - 95% ostial small second diagonal    - stent in distal RCA is patent. 2. Normal LV function 3. Normal LVEDP   Plan: compared to last cath report from [place] in 2017 there is no significant change. Recommend continued medical therapy. Area of ischemia noted on stress testing corresponds to diagonal disease that is not suitable for PCI.     ABIs 09/01/19: Right: Resting right ankle-brachial index indicates noncompressible right  lower extremity arteries. The right toe-brachial index is normal.   Left: Resting left ankle-brachial index indicates noncompressible left  lower extremity arteries. The left toe-brachial index is normal.    EKG:   10/25/2021: Normal sinus rhythm, rate 87, nonspecific T wave flattening 08/11/20: normal sinus rhythm,  rate 74, no ST abnormalities    Recent Labs: 08/10/2021: BUN 21; Creatinine, Ser 0.96; Hemoglobin 16.7; Magnesium 2.0; Platelets 143; Potassium 3.9; Sodium 136  Recent Lipid Panel    Component Value Date/Time   CHOL 97 (L)  08/11/2020 0904   TRIG 237 (H) 08/11/2020 0904   HDL 35 (L) 08/11/2020 0904   CHOLHDL 2.8 08/11/2020 0904   LDLCALC 26 08/11/2020 0904    Physical Exam:    VS:  BP 124/72 (BP Location: Left Arm, Patient Position: Sitting, Cuff Size: Large)   Pulse 87   Ht 6\' 1"  (1.854 m)   Wt 260 lb 6.4 oz (118.1 kg)   SpO2 93%   BMI 34.36 kg/m     Wt Readings from Last 3 Encounters:  10/25/21 260 lb 6.4 oz (118.1 kg)  11/24/20 251 lb (113.9 kg)  08/11/20 253 lb 6.4 oz (114.9 kg)     GEN:  Well nourished, well developed in no acute distress HEENT: Normal NECK: No JVD CARDIAC: RRR, 2/6 systolic murmur RESPIRATORY:  Clear to auscultation without rales, wheezing or rhonchi  ABDOMEN: Soft, non-tender, non-distended MUSCULOSKELETAL:  No edema; No deformity  SKIN: Warm and dry NEUROLOGIC:  Alert and oriented x 3 PSYCHIATRIC:  Normal affect   ASSESSMENT:    1. CAD in native artery   2. Pre-op evaluation   3. Hyperlipidemia, unspecified hyperlipidemia type   4. Cardiac murmur   5. Essential hypertension     PLAN:     CAD: Status post mid LAD and distal RCA stenting.  Lexiscan Myoview showed anterior ischemia.  Cath on 08/05/2019 showed patent stents, occluded D1, 95% D2 not amenable to intervention.  Medical management recommended.   -Continue ASA 81 mg, rosuvastatin 20 mg daily.  Plavix discontinued given iron deficiency anemia -Continue Toprol-XL 100 mg daily -Continue Imdur 60 mg daily -SL NTG prn  Preop evaluation: Prior to shoulder surgery.  He reports baseline chest pain, which sounds noncardiac as describes twisting chest pain that lasts for few seconds and resolves.  Functional capacity greater than 4 METS, denies exertional chest pain.  RCRI score 2 given CAD, IDDM.  Overall would classify as intermediate risk for an intermediate risk surgery -Worsening systolic murmur on exam, had aortic sclerosis on echo 07/2019, will repeat echo to evaluate for progression to aortic stenosis.   If unremarkable, no further work-up recommended prior to surgery. -Given his history of coronary  would not hold aspirin for his surgery.  Continue aspirin 81 mg perioperatively  Iron deficiency anemia: At clinic appointment for 08/11/20, found to have iron deficiency anemia (hemoglobin decreased from 15.3 on 08/01/19 to 7.9 on 08/11/2020, MCV 64, ferritin 2).  He was sent to the ED and received blood transfusion.  He declined admission.  He was referred to GI.  Reports no recent bleeding.  Anemia has resolved  Palpitations: had planned Zio 08/2020 but he decided against wearing the monitor.  No recent palpitations.  Hypertension: appears controlled, continue hydrochlorothiazide 25 mg daily, Imdur 60 mg daily, metoprolol 100 mg daily, and losartan 100 mg  Type 2 diabetes: On insulin.  A1c 6.9 on 06/04/2019  Hyperlipidemia: On rosuvastatin 20 mg daily.  LDL 26 on 08/11/20  Leg pain: ABIs show noncompressible vessels but normal TBI's.   RTC in 6 months  Medication Adjustments/Labs and Tests Ordered: Current medicines are reviewed at length with the patient today.  Concerns regarding medicines are outlined above.  Orders Placed This Encounter  Procedures   Lipid panel   EKG 12-Lead   ECHOCARDIOGRAM COMPLETE    Meds ordered this encounter  Medications   nitroGLYCERIN (NITROSTAT) 0.4 MG SL tablet    Sig: Place 1 tablet (0.4 mg total) under the tongue every 5 (five) minutes as needed for chest pain.    Dispense:  25 tablet    Refill:  3   metoprolol succinate (TOPROL-XL) 100 MG 24 hr tablet    Sig: Take 1 tablet (100 mg total) by mouth daily. Take with or immediately following a meal.    Dispense:  90 tablet    Refill:  3     Patient Instructions  Medication Instructions:  Your physician recommends that you continue on your current medications as directed. Please refer to the Current Medication list given to you today.  *If you need a refill on your cardiac medications before  your next appointment, please call your pharmacy*   Lab Work: Lipid today  If you have labs (blood work) drawn today and your tests are completely normal, you will receive your results only by: MyChart Message (if you have MyChart) OR A paper copy in the mail If you have any lab test that is abnormal or we need to change your treatment, we will call you to review the results.   Testing/Procedures: Your physician has requested that you have an echocardiogram. Echocardiography is a painless test that uses sound waves to create images of your heart. It provides your doctor with information about the size and shape of your heart and how well your heart's chambers and valves are working. This procedure takes approximately one hour. There are no restrictions for this procedure.  This will be done at our [address] location:  [address]  Follow-Up: At [facility], you and your health needs are our priority.  As part of our continuing mission to provide you with exceptional heart care, we have created designated Provider Care Teams.  These Care Teams include your primary Cardiologist (physician) and Advanced Practice Providers (APPs -  Physician Assistants and Nurse Practitioners) who all work together to provide you with the care you need, when you need it.  We recommend signing up for the patient portal called "MyChart".  Sign up information is provided on this After Visit Summary.  MyChart is used to connect with patients for Virtual Visits (Telemedicine).  Patients are able to view lab/test results, encounter notes, upcoming   appointments, etc.  Non-urgent messages can be sent to your provider as well.   To learn more about what you can do with MyChart, go to [email or web address].    Your next appointment:   6 month(s)  The format for your next appointment:   In Person  Provider:   Taheerah Guldin L Faheem Ziemann, MD {    Important Information About Sugar            Signed, Tyreon Frigon L Kemaya Dorner, MD  10/25/2021 6:25 PM    [facility] 

## 2022-04-27 ENCOUNTER — Other Ambulatory Visit (HOSPITAL_COMMUNITY): Payer: Self-pay

## 2022-04-27 LAB — CBC
HCT: 29.2 % — ABNORMAL LOW (ref 39.0–52.0)
Hemoglobin: 8.3 g/dL — ABNORMAL LOW (ref 13.0–17.0)
MCH: 26 pg (ref 26.0–34.0)
MCHC: 28.4 g/dL — ABNORMAL LOW (ref 30.0–36.0)
MCV: 91.5 fL (ref 80.0–100.0)
Platelets: 120 10*3/uL — ABNORMAL LOW (ref 150–400)
RBC: 3.19 MIL/uL — ABNORMAL LOW (ref 4.22–5.81)
RDW: 20.8 % — ABNORMAL HIGH (ref 11.5–15.5)
WBC: 7.2 10*3/uL (ref 4.0–10.5)
nRBC: 1.5 % — ABNORMAL HIGH (ref 0.0–0.2)

## 2022-04-27 LAB — BASIC METABOLIC PANEL
Anion gap: 5 (ref 5–15)
BUN: 30 mg/dL — ABNORMAL HIGH (ref 8–23)
CO2: 26 mmol/L (ref 22–32)
Calcium: 8.5 mg/dL — ABNORMAL LOW (ref 8.9–10.3)
Chloride: 115 mmol/L — ABNORMAL HIGH (ref 98–111)
Creatinine, Ser: 0.5 mg/dL — ABNORMAL LOW (ref 0.61–1.24)
GFR, Estimated: >60 mL/min (ref >60)
Glucose, Bld: 208 mg/dL — ABNORMAL HIGH (ref 70–99)
Potassium: 4.5 mmol/L (ref 3.5–5.1)
Sodium: 146 mmol/L — ABNORMAL HIGH (ref 135–145)

## 2022-04-27 LAB — MAGNESIUM: Magnesium: 2.1 mg/dL (ref 1.7–2.4)

## 2022-04-28 LAB — AMMONIA: Ammonia: 33 umol/L (ref 9–35)

## 2022-04-28 LAB — SODIUM: Sodium: 143 mmol/L (ref 135–145)

## 2022-04-29 ENCOUNTER — Other Ambulatory Visit (HOSPITAL_COMMUNITY): Payer: Self-pay

## 2022-04-30 ENCOUNTER — Other Ambulatory Visit (HOSPITAL_COMMUNITY): Payer: Self-pay

## 2022-04-30 LAB — MAGNESIUM
Magnesium: 1.7 mg/dL (ref 1.7–2.4)
Magnesium: 1.8 mg/dL (ref 1.7–2.4)

## 2022-04-30 LAB — CBC
HCT: 28.2 % — ABNORMAL LOW (ref 39.0–52.0)
Hemoglobin: 7.7 g/dL — ABNORMAL LOW (ref 13.0–17.0)
MCH: 24.8 pg — ABNORMAL LOW (ref 26.0–34.0)
MCHC: 27.3 g/dL — ABNORMAL LOW (ref 30.0–36.0)
MCV: 90.7 fL (ref 80.0–100.0)
Platelets: 102 10*3/uL — ABNORMAL LOW (ref 150–400)
RBC: 3.11 MIL/uL — ABNORMAL LOW (ref 4.22–5.81)
RDW: 19.9 % — ABNORMAL HIGH (ref 11.5–15.5)
WBC: 6.3 10*3/uL (ref 4.0–10.5)
nRBC: 0.9 % — ABNORMAL HIGH (ref 0.0–0.2)

## 2022-04-30 LAB — BASIC METABOLIC PANEL
Anion gap: 5 (ref 5–15)
BUN: 23 mg/dL (ref 8–23)
CO2: 22 mmol/L (ref 22–32)
Calcium: 8.2 mg/dL — ABNORMAL LOW (ref 8.9–10.3)
Chloride: 113 mmol/L — ABNORMAL HIGH (ref 98–111)
Creatinine, Ser: 0.39 mg/dL — ABNORMAL LOW (ref 0.61–1.24)
GFR, Estimated: >60 mL/min (ref >60)
Glucose, Bld: 122 mg/dL — ABNORMAL HIGH (ref 70–99)
Potassium: 4 mmol/L (ref 3.5–5.1)
Sodium: 140 mmol/L (ref 135–145)

## 2022-05-03 ENCOUNTER — Other Ambulatory Visit (HOSPITAL_COMMUNITY): Payer: Self-pay

## 2022-05-04 ENCOUNTER — Encounter (HOSPITAL_BASED_OUTPATIENT_CLINIC_OR_DEPARTMENT_OTHER): Payer: No Typology Code available for payment source

## 2022-05-04 DIAGNOSIS — R52 Pain, unspecified: Secondary | ICD-10-CM

## 2022-05-04 DIAGNOSIS — L89150 Pressure ulcer of sacral region, unstageable: Secondary | ICD-10-CM | POA: Diagnosis not present

## 2022-05-04 DIAGNOSIS — M7989 Other specified soft tissue disorders: Secondary | ICD-10-CM | POA: Diagnosis not present

## 2022-05-04 LAB — COMPREHENSIVE METABOLIC PANEL
ALT: 48 U/L — ABNORMAL HIGH (ref 0–44)
AST: 53 U/L — ABNORMAL HIGH (ref 15–41)
Albumin: 1.5 g/dL — ABNORMAL LOW (ref 3.5–5.0)
Alkaline Phosphatase: 519 U/L — ABNORMAL HIGH (ref 38–126)
Anion gap: 6 (ref 5–15)
BUN: 8 mg/dL (ref 8–23)
CO2: 24 mmol/L (ref 22–32)
Calcium: 7.9 mg/dL — ABNORMAL LOW (ref 8.9–10.3)
Chloride: 105 mmol/L (ref 98–111)
Creatinine, Ser: <0.3 mg/dL — ABNORMAL LOW (ref 0.61–1.24)
Glucose, Bld: 126 mg/dL — ABNORMAL HIGH (ref 70–99)
Potassium: 3.5 mmol/L (ref 3.5–5.1)
Sodium: 135 mmol/L (ref 135–145)
Total Bilirubin: 0.6 mg/dL (ref 0.3–1.2)
Total Protein: 4.7 g/dL — ABNORMAL LOW (ref 6.5–8.1)

## 2022-05-04 LAB — CBC
HCT: 27.6 % — ABNORMAL LOW (ref 39.0–52.0)
Hemoglobin: 8.1 g/dL — ABNORMAL LOW (ref 13.0–17.0)
MCH: 24.9 pg — ABNORMAL LOW (ref 26.0–34.0)
MCHC: 29.3 g/dL — ABNORMAL LOW (ref 30.0–36.0)
MCV: 84.9 fL (ref 80.0–100.0)
Platelets: 103 10*3/uL — ABNORMAL LOW (ref 150–400)
RBC: 3.25 MIL/uL — ABNORMAL LOW (ref 4.22–5.81)
RDW: 18.8 % — ABNORMAL HIGH (ref 11.5–15.5)
WBC: 5.7 10*3/uL (ref 4.0–10.5)
nRBC: 0.4 % — ABNORMAL HIGH (ref 0.0–0.2)

## 2022-05-04 LAB — AMMONIA: Ammonia: 47 umol/L — ABNORMAL HIGH (ref 9–35)

## 2022-05-08 LAB — MAGNESIUM: Magnesium: 1.5 mg/dL — ABNORMAL LOW (ref 1.7–2.4)

## 2022-05-08 LAB — CBC
HCT: 26 % — ABNORMAL LOW (ref 39.0–52.0)
Hemoglobin: 7.6 g/dL — ABNORMAL LOW (ref 13.0–17.0)
MCH: 24.4 pg — ABNORMAL LOW (ref 26.0–34.0)
MCHC: 29.2 g/dL — ABNORMAL LOW (ref 30.0–36.0)
MCV: 83.3 fL (ref 80.0–100.0)
Platelets: 101 10*3/uL — ABNORMAL LOW (ref 150–400)
RBC: 3.12 MIL/uL — ABNORMAL LOW (ref 4.22–5.81)
RDW: 18.2 % — ABNORMAL HIGH (ref 11.5–15.5)
WBC: 5 10*3/uL (ref 4.0–10.5)
nRBC: 0 % (ref 0.0–0.2)

## 2022-05-08 LAB — AMMONIA: Ammonia: 40 umol/L — ABNORMAL HIGH (ref 9–35)

## 2022-05-08 LAB — BASIC METABOLIC PANEL
Anion gap: 7 (ref 5–15)
BUN: 5 mg/dL — ABNORMAL LOW (ref 8–23)
CO2: 25 mmol/L (ref 22–32)
Calcium: 8 mg/dL — ABNORMAL LOW (ref 8.9–10.3)
Chloride: 102 mmol/L (ref 98–111)
Creatinine, Ser: <0.3 mg/dL — ABNORMAL LOW (ref 0.61–1.24)
Glucose, Bld: 159 mg/dL — ABNORMAL HIGH (ref 70–99)
Potassium: 3.9 mmol/L (ref 3.5–5.1)
Sodium: 134 mmol/L — ABNORMAL LOW (ref 135–145)

## 2022-05-09 LAB — MAGNESIUM
Magnesium: 1.6 mg/dL — ABNORMAL LOW (ref 1.7–2.4)
Magnesium: 1.7 mg/dL (ref 1.7–2.4)

## 2022-05-10 ENCOUNTER — Other Ambulatory Visit (HOSPITAL_COMMUNITY): Payer: Self-pay

## 2022-05-10 LAB — CBC
HCT: 27.5 % — ABNORMAL LOW (ref 39.0–52.0)
Hemoglobin: 7.7 g/dL — ABNORMAL LOW (ref 13.0–17.0)
MCH: 23.5 pg — ABNORMAL LOW (ref 26.0–34.0)
MCHC: 28 g/dL — ABNORMAL LOW (ref 30.0–36.0)
MCV: 83.8 fL (ref 80.0–100.0)
Platelets: 106 10*3/uL — ABNORMAL LOW (ref 150–400)
RBC: 3.28 MIL/uL — ABNORMAL LOW (ref 4.22–5.81)
RDW: 17.9 % — ABNORMAL HIGH (ref 11.5–15.5)
WBC: 4.9 10*3/uL (ref 4.0–10.5)
nRBC: 0 % (ref 0.0–0.2)

## 2022-05-10 LAB — BASIC METABOLIC PANEL
Anion gap: 6 (ref 5–15)
BUN: 7 mg/dL — ABNORMAL LOW (ref 8–23)
CO2: 26 mmol/L (ref 22–32)
Calcium: 8 mg/dL — ABNORMAL LOW (ref 8.9–10.3)
Chloride: 101 mmol/L (ref 98–111)
Creatinine, Ser: 0.32 mg/dL — ABNORMAL LOW (ref 0.61–1.24)
GFR, Estimated: >60 mL/min (ref >60)
Glucose, Bld: 126 mg/dL — ABNORMAL HIGH (ref 70–99)
Potassium: 4.2 mmol/L (ref 3.5–5.1)
Sodium: 133 mmol/L — ABNORMAL LOW (ref 135–145)

## 2022-05-10 LAB — AMMONIA: Ammonia: 55 umol/L — ABNORMAL HIGH (ref 9–35)

## 2022-05-10 LAB — MAGNESIUM: Magnesium: 1.8 mg/dL (ref 1.7–2.4)

## 2022-05-13 LAB — CBC
HCT: 28.9 % — ABNORMAL LOW (ref 39.0–52.0)
Hemoglobin: 8.2 g/dL — ABNORMAL LOW (ref 13.0–17.0)
MCH: 23.3 pg — ABNORMAL LOW (ref 26.0–34.0)
MCHC: 28.4 g/dL — ABNORMAL LOW (ref 30.0–36.0)
MCV: 82.1 fL (ref 80.0–100.0)
Platelets: 134 10*3/uL — ABNORMAL LOW (ref 150–400)
RBC: 3.52 MIL/uL — ABNORMAL LOW (ref 4.22–5.81)
RDW: 17.6 % — ABNORMAL HIGH (ref 11.5–15.5)
WBC: 4.4 10*3/uL (ref 4.0–10.5)
nRBC: 0 % (ref 0.0–0.2)

## 2022-05-13 LAB — BASIC METABOLIC PANEL
Anion gap: 6 (ref 5–15)
BUN: <5 mg/dL — ABNORMAL LOW (ref 8–23)
CO2: 31 mmol/L (ref 22–32)
Calcium: 8.1 mg/dL — ABNORMAL LOW (ref 8.9–10.3)
Chloride: 98 mmol/L (ref 98–111)
Creatinine, Ser: 0.34 mg/dL — ABNORMAL LOW (ref 0.61–1.24)
GFR, Estimated: >60 mL/min (ref >60)
Glucose, Bld: 127 mg/dL — ABNORMAL HIGH (ref 70–99)
Potassium: 3.7 mmol/L (ref 3.5–5.1)
Sodium: 135 mmol/L (ref 135–145)

## 2022-05-13 LAB — MAGNESIUM
Magnesium: 1.5 mg/dL — ABNORMAL LOW (ref 1.7–2.4)
Magnesium: 1.7 mg/dL (ref 1.7–2.4)

## 2022-05-14 LAB — MAGNESIUM: Magnesium: 1.8 mg/dL (ref 1.7–2.4)

## 2022-05-14 LAB — CBC
HCT: 27.2 % — ABNORMAL LOW (ref 39.0–52.0)
Hemoglobin: 8.1 g/dL — ABNORMAL LOW (ref 13.0–17.0)
MCH: 24 pg — ABNORMAL LOW (ref 26.0–34.0)
MCHC: 29.8 g/dL — ABNORMAL LOW (ref 30.0–36.0)
MCV: 80.5 fL (ref 80.0–100.0)
Platelets: 127 10*3/uL — ABNORMAL LOW (ref 150–400)
RBC: 3.38 MIL/uL — ABNORMAL LOW (ref 4.22–5.81)
RDW: 17.6 % — ABNORMAL HIGH (ref 11.5–15.5)
WBC: 5 10*3/uL (ref 4.0–10.5)
nRBC: 0 % (ref 0.0–0.2)

## 2022-05-14 LAB — BASIC METABOLIC PANEL
Anion gap: 8 (ref 5–15)
BUN: <5 mg/dL — ABNORMAL LOW (ref 8–23)
CO2: 30 mmol/L (ref 22–32)
Calcium: 8 mg/dL — ABNORMAL LOW (ref 8.9–10.3)
Chloride: 95 mmol/L — ABNORMAL LOW (ref 98–111)
Creatinine, Ser: 0.36 mg/dL — ABNORMAL LOW (ref 0.61–1.24)
GFR, Estimated: >60 mL/min (ref >60)
Glucose, Bld: 136 mg/dL — ABNORMAL HIGH (ref 70–99)
Potassium: 3.7 mmol/L (ref 3.5–5.1)
Sodium: 133 mmol/L — ABNORMAL LOW (ref 135–145)

## 2022-05-14 LAB — AMMONIA: Ammonia: 35 umol/L (ref 9–35)

## 2022-05-17 LAB — BASIC METABOLIC PANEL
Anion gap: 5 (ref 5–15)
BUN: 6 mg/dL — ABNORMAL LOW (ref 8–23)
CO2: 32 mmol/L (ref 22–32)
Calcium: 8 mg/dL — ABNORMAL LOW (ref 8.9–10.3)
Chloride: 98 mmol/L (ref 98–111)
Creatinine, Ser: 0.43 mg/dL — ABNORMAL LOW (ref 0.61–1.24)
GFR, Estimated: >60 mL/min (ref >60)
Glucose, Bld: 86 mg/dL (ref 70–99)
Potassium: 4.4 mmol/L (ref 3.5–5.1)
Sodium: 135 mmol/L (ref 135–145)

## 2022-05-17 LAB — CBC
HCT: 27 % — ABNORMAL LOW (ref 39.0–52.0)
Hemoglobin: 7.8 g/dL — ABNORMAL LOW (ref 13.0–17.0)
MCH: 23 pg — ABNORMAL LOW (ref 26.0–34.0)
MCHC: 28.9 g/dL — ABNORMAL LOW (ref 30.0–36.0)
MCV: 79.6 fL — ABNORMAL LOW (ref 80.0–100.0)
Platelets: 143 10*3/uL — ABNORMAL LOW (ref 150–400)
RBC: 3.39 MIL/uL — ABNORMAL LOW (ref 4.22–5.81)
RDW: 17.7 % — ABNORMAL HIGH (ref 11.5–15.5)
WBC: 5.3 10*3/uL (ref 4.0–10.5)
nRBC: 0 % (ref 0.0–0.2)

## 2022-05-17 LAB — MAGNESIUM
Magnesium: 1.6 mg/dL — ABNORMAL LOW (ref 1.7–2.4)
Magnesium: 1.7 mg/dL (ref 1.7–2.4)

## 2022-05-18 ENCOUNTER — Other Ambulatory Visit (HOSPITAL_COMMUNITY): Payer: Self-pay

## 2022-05-18 LAB — MAGNESIUM: Magnesium: 1.9 mg/dL (ref 1.7–2.4)

## 2022-05-20 ENCOUNTER — Other Ambulatory Visit (HOSPITAL_COMMUNITY): Payer: Self-pay

## 2022-05-22 LAB — COMPREHENSIVE METABOLIC PANEL
ALT: 25 U/L (ref 0–44)
AST: 32 U/L (ref 15–41)
Albumin: <1.5 g/dL — ABNORMAL LOW (ref 3.5–5.0)
Alkaline Phosphatase: 410 U/L — ABNORMAL HIGH (ref 38–126)
Anion gap: 6 (ref 5–15)
BUN: 7 mg/dL — ABNORMAL LOW (ref 8–23)
CO2: 29 mmol/L (ref 22–32)
Calcium: 8.1 mg/dL — ABNORMAL LOW (ref 8.9–10.3)
Chloride: 98 mmol/L (ref 98–111)
Creatinine, Ser: 0.35 mg/dL — ABNORMAL LOW (ref 0.61–1.24)
GFR, Estimated: >60 mL/min (ref >60)
Glucose, Bld: 159 mg/dL — ABNORMAL HIGH (ref 70–99)
Potassium: 3.7 mmol/L (ref 3.5–5.1)
Sodium: 133 mmol/L — ABNORMAL LOW (ref 135–145)
Total Bilirubin: 0.3 mg/dL (ref 0.3–1.2)
Total Protein: 4.7 g/dL — ABNORMAL LOW (ref 6.5–8.1)

## 2022-05-22 LAB — CBC
HCT: 31.3 % — ABNORMAL LOW (ref 39.0–52.0)
Hemoglobin: 8.8 g/dL — ABNORMAL LOW (ref 13.0–17.0)
MCH: 22.4 pg — ABNORMAL LOW (ref 26.0–34.0)
MCHC: 28.1 g/dL — ABNORMAL LOW (ref 30.0–36.0)
MCV: 79.8 fL — ABNORMAL LOW (ref 80.0–100.0)
Platelets: 159 10*3/uL (ref 150–400)
RBC: 3.92 MIL/uL — ABNORMAL LOW (ref 4.22–5.81)
RDW: 17.6 % — ABNORMAL HIGH (ref 11.5–15.5)
WBC: 6.3 10*3/uL (ref 4.0–10.5)
nRBC: 0 % (ref 0.0–0.2)

## 2022-05-24 LAB — BASIC METABOLIC PANEL
Anion gap: 7 (ref 5–15)
BUN: 8 mg/dL (ref 8–23)
CO2: 29 mmol/L (ref 22–32)
Calcium: 8 mg/dL — ABNORMAL LOW (ref 8.9–10.3)
Chloride: 98 mmol/L (ref 98–111)
Creatinine, Ser: <0.3 mg/dL — ABNORMAL LOW (ref 0.61–1.24)
Glucose, Bld: 95 mg/dL (ref 70–99)
Potassium: 3.7 mmol/L (ref 3.5–5.1)
Sodium: 134 mmol/L — ABNORMAL LOW (ref 135–145)

## 2022-05-26 ENCOUNTER — Other Ambulatory Visit (HOSPITAL_COMMUNITY): Payer: Self-pay

## 2022-05-26 LAB — CBC
HCT: 32.3 % — ABNORMAL LOW (ref 39.0–52.0)
Hemoglobin: 9.1 g/dL — ABNORMAL LOW (ref 13.0–17.0)
MCH: 22.1 pg — ABNORMAL LOW (ref 26.0–34.0)
MCHC: 28.2 g/dL — ABNORMAL LOW (ref 30.0–36.0)
MCV: 78.6 fL — ABNORMAL LOW (ref 80.0–100.0)
Platelets: 194 10*3/uL (ref 150–400)
RBC: 4.11 MIL/uL — ABNORMAL LOW (ref 4.22–5.81)
RDW: 17.9 % — ABNORMAL HIGH (ref 11.5–15.5)
WBC: 7.7 10*3/uL (ref 4.0–10.5)
nRBC: 0 % (ref 0.0–0.2)

## 2022-05-26 LAB — BASIC METABOLIC PANEL
Anion gap: 6 (ref 5–15)
BUN: 9 mg/dL (ref 8–23)
CO2: 32 mmol/L (ref 22–32)
Calcium: 8.3 mg/dL — ABNORMAL LOW (ref 8.9–10.3)
Chloride: 97 mmol/L — ABNORMAL LOW (ref 98–111)
Creatinine, Ser: 0.4 mg/dL — ABNORMAL LOW (ref 0.61–1.24)
GFR, Estimated: >60 mL/min (ref >60)
Glucose, Bld: 72 mg/dL (ref 70–99)
Potassium: 4.1 mmol/L (ref 3.5–5.1)
Sodium: 135 mmol/L (ref 135–145)

## 2022-05-27 LAB — CBC
HCT: 29 % — ABNORMAL LOW (ref 39.0–52.0)
Hemoglobin: 8.5 g/dL — ABNORMAL LOW (ref 13.0–17.0)
MCH: 22.7 pg — ABNORMAL LOW (ref 26.0–34.0)
MCHC: 29.3 g/dL — ABNORMAL LOW (ref 30.0–36.0)
MCV: 77.3 fL — ABNORMAL LOW (ref 80.0–100.0)
Platelets: 152 10*3/uL (ref 150–400)
RBC: 3.75 MIL/uL — ABNORMAL LOW (ref 4.22–5.81)
RDW: 17.7 % — ABNORMAL HIGH (ref 11.5–15.5)
WBC: 6.8 10*3/uL (ref 4.0–10.5)
nRBC: 0 % (ref 0.0–0.2)

## 2022-05-27 LAB — BASIC METABOLIC PANEL
Anion gap: 7 (ref 5–15)
BUN: 11 mg/dL (ref 8–23)
CO2: 27 mmol/L (ref 22–32)
Calcium: 7.9 mg/dL — ABNORMAL LOW (ref 8.9–10.3)
Chloride: 98 mmol/L (ref 98–111)
Creatinine, Ser: 0.44 mg/dL — ABNORMAL LOW (ref 0.61–1.24)
GFR, Estimated: >60 mL/min (ref >60)
Glucose, Bld: 94 mg/dL (ref 70–99)
Potassium: 4.3 mmol/L (ref 3.5–5.1)
Sodium: 132 mmol/L — ABNORMAL LOW (ref 135–145)

## 2022-05-27 LAB — MAGNESIUM: Magnesium: 1.7 mg/dL (ref 1.7–2.4)

## 2022-05-28 LAB — MAGNESIUM
Magnesium: 1.6 mg/dL — ABNORMAL LOW (ref 1.7–2.4)
Magnesium: 1.7 mg/dL (ref 1.7–2.4)

## 2022-05-29 LAB — MAGNESIUM: Magnesium: 1.8 mg/dL (ref 1.7–2.4)

## 2022-05-30 LAB — CBC WITH DIFFERENTIAL/PLATELET
Abs Immature Granulocytes: 0.02 10*3/uL (ref 0.00–0.07)
Basophils Absolute: 0 10*3/uL (ref 0.0–0.1)
Basophils Relative: 1 %
Eosinophils Absolute: 0.2 10*3/uL (ref 0.0–0.5)
Eosinophils Relative: 3 %
HCT: 28.3 % — ABNORMAL LOW (ref 39.0–52.0)
Hemoglobin: 8.4 g/dL — ABNORMAL LOW (ref 13.0–17.0)
Immature Granulocytes: 0 %
Lymphocytes Relative: 19 %
Lymphs Abs: 1 10*3/uL (ref 0.7–4.0)
MCH: 22.8 pg — ABNORMAL LOW (ref 26.0–34.0)
MCHC: 29.7 g/dL — ABNORMAL LOW (ref 30.0–36.0)
MCV: 76.9 fL — ABNORMAL LOW (ref 80.0–100.0)
Monocytes Absolute: 0.7 10*3/uL (ref 0.1–1.0)
Monocytes Relative: 12 %
Neutro Abs: 3.5 10*3/uL (ref 1.7–7.7)
Neutrophils Relative %: 65 %
Platelets: 148 10*3/uL — ABNORMAL LOW (ref 150–400)
RBC: 3.68 MIL/uL — ABNORMAL LOW (ref 4.22–5.81)
RDW: 18 % — ABNORMAL HIGH (ref 11.5–15.5)
WBC: 5.4 10*3/uL (ref 4.0–10.5)
nRBC: 0 % (ref 0.0–0.2)

## 2022-05-30 LAB — COMPREHENSIVE METABOLIC PANEL
ALT: 23 U/L (ref 0–44)
AST: 34 U/L (ref 15–41)
Albumin: <1.5 g/dL — ABNORMAL LOW (ref 3.5–5.0)
Alkaline Phosphatase: 409 U/L — ABNORMAL HIGH (ref 38–126)
Anion gap: 4 — ABNORMAL LOW (ref 5–15)
BUN: 10 mg/dL (ref 8–23)
CO2: 31 mmol/L (ref 22–32)
Calcium: 7.7 mg/dL — ABNORMAL LOW (ref 8.9–10.3)
Chloride: 100 mmol/L (ref 98–111)
Creatinine, Ser: 0.36 mg/dL — ABNORMAL LOW (ref 0.61–1.24)
GFR, Estimated: >60 mL/min (ref >60)
Glucose, Bld: 141 mg/dL — ABNORMAL HIGH (ref 70–99)
Potassium: 4.1 mmol/L (ref 3.5–5.1)
Sodium: 135 mmol/L (ref 135–145)
Total Bilirubin: 0.5 mg/dL (ref 0.3–1.2)
Total Protein: 4.7 g/dL — ABNORMAL LOW (ref 6.5–8.1)

## 2022-05-30 LAB — APTT: aPTT: 49 seconds — ABNORMAL HIGH (ref 24–36)

## 2022-05-30 LAB — MAGNESIUM: Magnesium: 1.8 mg/dL (ref 1.7–2.4)

## 2022-05-30 LAB — PROTIME-INR
INR: 1.7 — ABNORMAL HIGH (ref 0.8–1.2)
Prothrombin Time: 19.8 seconds — ABNORMAL HIGH (ref 11.4–15.2)

## 2022-05-30 LAB — PHOSPHORUS: Phosphorus: 4 mg/dL (ref 2.5–4.6)

## 2022-05-31 LAB — COMPREHENSIVE METABOLIC PANEL
ALT: 22 U/L (ref 0–44)
AST: 31 U/L (ref 15–41)
Albumin: <1.5 g/dL — ABNORMAL LOW (ref 3.5–5.0)
Alkaline Phosphatase: 400 U/L — ABNORMAL HIGH (ref 38–126)
Anion gap: 7 (ref 5–15)
BUN: 12 mg/dL (ref 8–23)
CO2: 30 mmol/L (ref 22–32)
Calcium: 8 mg/dL — ABNORMAL LOW (ref 8.9–10.3)
Chloride: 99 mmol/L (ref 98–111)
Creatinine, Ser: 0.39 mg/dL — ABNORMAL LOW (ref 0.61–1.24)
GFR, Estimated: >60 mL/min (ref >60)
Glucose, Bld: 121 mg/dL — ABNORMAL HIGH (ref 70–99)
Potassium: 4.1 mmol/L (ref 3.5–5.1)
Sodium: 136 mmol/L (ref 135–145)
Total Bilirubin: 0.3 mg/dL (ref 0.3–1.2)
Total Protein: 4.7 g/dL — ABNORMAL LOW (ref 6.5–8.1)

## 2022-05-31 LAB — PHOSPHORUS: Phosphorus: 4.2 mg/dL (ref 2.5–4.6)

## 2022-05-31 LAB — CBC
HCT: 31.2 % — ABNORMAL LOW (ref 39.0–52.0)
Hemoglobin: 8.7 g/dL — ABNORMAL LOW (ref 13.0–17.0)
MCH: 21.8 pg — ABNORMAL LOW (ref 26.0–34.0)
MCHC: 27.9 g/dL — ABNORMAL LOW (ref 30.0–36.0)
MCV: 78 fL — ABNORMAL LOW (ref 80.0–100.0)
Platelets: 138 10*3/uL — ABNORMAL LOW (ref 150–400)
RBC: 4 MIL/uL — ABNORMAL LOW (ref 4.22–5.81)
RDW: 17.9 % — ABNORMAL HIGH (ref 11.5–15.5)
WBC: 5.4 10*3/uL (ref 4.0–10.5)
nRBC: 0 % (ref 0.0–0.2)

## 2022-05-31 LAB — MAGNESIUM: Magnesium: 1.7 mg/dL (ref 1.7–2.4)

## 2022-09-01 ENCOUNTER — Inpatient Hospital Stay
Admission: RE | Admit: 2022-09-01 | Discharge: 2022-10-04 | Disposition: A | Discharge status: Hospice | Payer: No Typology Code available for payment source | Attending: Internal Medicine | Admitting: Internal Medicine

## 2022-09-01 ENCOUNTER — Other Ambulatory Visit (HOSPITAL_COMMUNITY): Payer: No Typology Code available for payment source

## 2022-09-01 DIAGNOSIS — K769 Liver disease, unspecified: Secondary | ICD-10-CM | POA: Insufficient documentation

## 2022-09-01 DIAGNOSIS — J9621 Acute and chronic respiratory failure with hypoxia: Secondary | ICD-10-CM | POA: Insufficient documentation

## 2022-09-01 DIAGNOSIS — K7031 Alcoholic cirrhosis of liver with ascites: Secondary | ICD-10-CM | POA: Insufficient documentation

## 2022-09-01 DIAGNOSIS — K7460 Unspecified cirrhosis of liver: Secondary | ICD-10-CM | POA: Insufficient documentation

## 2022-09-01 DIAGNOSIS — J918 Pleural effusion in other conditions classified elsewhere: Secondary | ICD-10-CM | POA: Insufficient documentation

## 2022-09-01 DIAGNOSIS — G894 Chronic pain syndrome: Secondary | ICD-10-CM | POA: Insufficient documentation

## 2022-09-01 LAB — COMPREHENSIVE METABOLIC PANEL
ALT: 203 U/L — ABNORMAL HIGH (ref 0–44)
AST: 141 U/L — ABNORMAL HIGH (ref 15–41)
Albumin: 2 g/dL — ABNORMAL LOW (ref 3.5–5.0)
Alkaline Phosphatase: 1183 U/L — ABNORMAL HIGH (ref 38–126)
Anion gap: 8 (ref 5–15)
BUN: 68 mg/dL — ABNORMAL HIGH (ref 8–23)
CO2: 30 mmol/L (ref 22–32)
Calcium: 8.5 mg/dL — ABNORMAL LOW (ref 8.9–10.3)
Chloride: 113 mmol/L — ABNORMAL HIGH (ref 98–111)
Creatinine, Ser: 0.58 mg/dL — ABNORMAL LOW (ref 0.61–1.24)
GFR, Estimated: >60 mL/min (ref >60)
Glucose, Bld: 165 mg/dL — ABNORMAL HIGH (ref 70–99)
Potassium: 4.1 mmol/L (ref 3.5–5.1)
Sodium: 151 mmol/L — ABNORMAL HIGH (ref 135–145)
Total Bilirubin: 1.3 mg/dL — ABNORMAL HIGH (ref 0.3–1.2)
Total Protein: 5.9 g/dL — ABNORMAL LOW (ref 6.5–8.1)

## 2022-09-01 LAB — CBC
HCT: 32.7 % — ABNORMAL LOW (ref 39.0–52.0)
Hemoglobin: 9.7 g/dL — ABNORMAL LOW (ref 13.0–17.0)
MCH: 27.1 pg (ref 26.0–34.0)
MCHC: 29.7 g/dL — ABNORMAL LOW (ref 30.0–36.0)
MCV: 91.3 fL (ref 80.0–100.0)
Platelets: 217 10*3/uL (ref 150–400)
RBC: 3.58 MIL/uL — ABNORMAL LOW (ref 4.22–5.81)
RDW: 28 % — ABNORMAL HIGH (ref 11.5–15.5)
WBC: 9.3 10*3/uL (ref 4.0–10.5)
nRBC: 0.6 % — ABNORMAL HIGH (ref 0.0–0.2)

## 2022-09-01 LAB — PROTIME-INR
INR: 1.5 — ABNORMAL HIGH (ref 0.8–1.2)
Prothrombin Time: 17.9 seconds — ABNORMAL HIGH (ref 11.4–15.2)

## 2022-09-01 LAB — APTT: aPTT: 31 seconds (ref 24–36)

## 2022-09-02 ENCOUNTER — Other Ambulatory Visit (HOSPITAL_COMMUNITY): Payer: No Typology Code available for payment source

## 2022-09-02 LAB — BLOOD GAS, ARTERIAL
Acid-Base Excess: 9.5 mmol/L — ABNORMAL HIGH (ref 0.0–2.0)
Bicarbonate: 33.5 mmol/L — ABNORMAL HIGH (ref 20.0–28.0)
Drawn by: 164
O2 Saturation: 95 %
Patient temperature: 37
pCO2 arterial: 42 mmHg (ref 32–48)
pH, Arterial: 7.51 — ABNORMAL HIGH (ref 7.35–7.45)
pO2, Arterial: 70 mmHg — ABNORMAL LOW (ref 83–108)

## 2022-09-02 LAB — AMMONIA: Ammonia: 55 umol/L — ABNORMAL HIGH (ref 9–35)

## 2022-09-03 ENCOUNTER — Other Ambulatory Visit (HOSPITAL_COMMUNITY): Payer: No Typology Code available for payment source

## 2022-09-03 LAB — CBC
HCT: 31.4 % — ABNORMAL LOW (ref 39.0–52.0)
Hemoglobin: 9 g/dL — ABNORMAL LOW (ref 13.0–17.0)
MCH: 27.2 pg (ref 26.0–34.0)
MCHC: 28.7 g/dL — ABNORMAL LOW (ref 30.0–36.0)
MCV: 94.9 fL (ref 80.0–100.0)
Platelets: 213 10*3/uL (ref 150–400)
RBC: 3.31 MIL/uL — ABNORMAL LOW (ref 4.22–5.81)
RDW: 27.8 % — ABNORMAL HIGH (ref 11.5–15.5)
WBC: 11.3 10*3/uL — ABNORMAL HIGH (ref 4.0–10.5)
nRBC: 1.3 % — ABNORMAL HIGH (ref 0.0–0.2)

## 2022-09-03 LAB — COMPREHENSIVE METABOLIC PANEL
ALT: 187 U/L — ABNORMAL HIGH (ref 0–44)
AST: 208 U/L — ABNORMAL HIGH (ref 15–41)
Albumin: 1.8 g/dL — ABNORMAL LOW (ref 3.5–5.0)
Alkaline Phosphatase: 1295 U/L — ABNORMAL HIGH (ref 38–126)
Anion gap: 7 (ref 5–15)
BUN: 74 mg/dL — ABNORMAL HIGH (ref 8–23)
CO2: 29 mmol/L (ref 22–32)
Calcium: 8.2 mg/dL — ABNORMAL LOW (ref 8.9–10.3)
Chloride: 119 mmol/L — ABNORMAL HIGH (ref 98–111)
Creatinine, Ser: 0.67 mg/dL (ref 0.61–1.24)
GFR, Estimated: >60 mL/min (ref >60)
Glucose, Bld: 123 mg/dL — ABNORMAL HIGH (ref 70–99)
Potassium: 4.2 mmol/L (ref 3.5–5.1)
Sodium: 155 mmol/L — ABNORMAL HIGH (ref 135–145)
Total Bilirubin: 1.1 mg/dL (ref 0.3–1.2)
Total Protein: 5.7 g/dL — ABNORMAL LOW (ref 6.5–8.1)

## 2022-09-03 LAB — AMMONIA: Ammonia: 43 umol/L — ABNORMAL HIGH (ref 9–35)

## 2022-09-03 LAB — MAGNESIUM: Magnesium: 3.2 mg/dL — ABNORMAL HIGH (ref 1.7–2.4)

## 2022-09-04 ENCOUNTER — Other Ambulatory Visit (HOSPITAL_COMMUNITY): Payer: No Typology Code available for payment source

## 2022-09-04 HISTORY — PX: IR PARACENTESIS: IMG2679

## 2022-09-04 LAB — BODY FLUID CELL COUNT WITH DIFFERENTIAL
Eos, Fluid: 0 %
Lymphs, Fluid: 20 %
Monocyte-Macrophage-Serous Fluid: 22 % — ABNORMAL LOW (ref 50–90)
Neutrophil Count, Fluid: 58 % — ABNORMAL HIGH (ref 0–25)
Total Nucleated Cell Count, Fluid: 137 cu mm (ref 0–1000)

## 2022-09-04 LAB — GLUCOSE, PLEURAL OR PERITONEAL FLUID: Glucose, Fluid: 195 mg/dL

## 2022-09-04 LAB — ALBUMIN, PLEURAL OR PERITONEAL FLUID: Albumin, Fluid: <1.5 g/dL

## 2022-09-04 LAB — BODY FLUID CULTURE W GRAM STAIN

## 2022-09-04 LAB — LACTATE DEHYDROGENASE, PLEURAL OR PERITONEAL FLUID: LD, Fluid: 164 U/L — ABNORMAL HIGH (ref 3–23)

## 2022-09-04 LAB — PROTEIN, PLEURAL OR PERITONEAL FLUID: Total protein, fluid: <3 g/dL

## 2022-09-04 MED ORDER — LIDOCAINE HCL 1 % IJ SOLN
INTRAMUSCULAR | Status: AC
  Filled 2022-09-04: qty 20

## 2022-09-04 NOTE — Procedures (Signed)
PROCEDURE SUMMARY:  Successful US guided paracentesis from right lateral abdomen.  Yielded 8.4 liters of hazy, dark yellow fluid.  No immediate complications.  Pt tolerated well.   Specimen was sent for labs.  EBL < 5mL  Hoyt Koch PA-C 09/04/2022 2:21 PM

## 2022-09-05 LAB — COMPREHENSIVE METABOLIC PANEL
ALT: 130 U/L — ABNORMAL HIGH (ref 0–44)
AST: 167 U/L — ABNORMAL HIGH (ref 15–41)
Albumin: 2.1 g/dL — ABNORMAL LOW (ref 3.5–5.0)
Alkaline Phosphatase: 1036 U/L — ABNORMAL HIGH (ref 38–126)
Anion gap: 11 (ref 5–15)
BUN: 87 mg/dL — ABNORMAL HIGH (ref 8–23)
CO2: 29 mmol/L (ref 22–32)
Calcium: 8.3 mg/dL — ABNORMAL LOW (ref 8.9–10.3)
Chloride: 114 mmol/L — ABNORMAL HIGH (ref 98–111)
Creatinine, Ser: 0.67 mg/dL (ref 0.61–1.24)
GFR, Estimated: >60 mL/min (ref >60)
Glucose, Bld: 314 mg/dL — ABNORMAL HIGH (ref 70–99)
Potassium: 3.9 mmol/L (ref 3.5–5.1)
Sodium: 154 mmol/L — ABNORMAL HIGH (ref 135–145)
Total Bilirubin: 1.1 mg/dL (ref 0.3–1.2)
Total Protein: 5.6 g/dL — ABNORMAL LOW (ref 6.5–8.1)

## 2022-09-05 LAB — CBC
HCT: 30.2 % — ABNORMAL LOW (ref 39.0–52.0)
Hemoglobin: 8.6 g/dL — ABNORMAL LOW (ref 13.0–17.0)
MCH: 27.7 pg (ref 26.0–34.0)
MCHC: 28.5 g/dL — ABNORMAL LOW (ref 30.0–36.0)
MCV: 97.1 fL (ref 80.0–100.0)
Platelets: 167 10*3/uL (ref 150–400)
RBC: 3.11 MIL/uL — ABNORMAL LOW (ref 4.22–5.81)
RDW: 26.9 % — ABNORMAL HIGH (ref 11.5–15.5)
WBC: 8.8 10*3/uL (ref 4.0–10.5)
nRBC: 0.3 % — ABNORMAL HIGH (ref 0.0–0.2)

## 2022-09-05 LAB — CYTOLOGY - NON PAP

## 2022-09-05 LAB — BODY FLUID CULTURE W GRAM STAIN

## 2022-09-06 LAB — BODY FLUID CULTURE W GRAM STAIN

## 2022-09-07 ENCOUNTER — Other Ambulatory Visit (HOSPITAL_COMMUNITY): Payer: No Typology Code available for payment source

## 2022-09-07 LAB — COMPREHENSIVE METABOLIC PANEL
ALT: 86 U/L — ABNORMAL HIGH (ref 0–44)
AST: 84 U/L — ABNORMAL HIGH (ref 15–41)
Albumin: 2.2 g/dL — ABNORMAL LOW (ref 3.5–5.0)
Alkaline Phosphatase: 805 U/L — ABNORMAL HIGH (ref 38–126)
Anion gap: 8 (ref 5–15)
BUN: 97 mg/dL — ABNORMAL HIGH (ref 8–23)
CO2: 28 mmol/L (ref 22–32)
Calcium: 8.5 mg/dL — ABNORMAL LOW (ref 8.9–10.3)
Chloride: 115 mmol/L — ABNORMAL HIGH (ref 98–111)
Creatinine, Ser: 0.73 mg/dL (ref 0.61–1.24)
GFR, Estimated: >60 mL/min (ref >60)
Glucose, Bld: 259 mg/dL — ABNORMAL HIGH (ref 70–99)
Potassium: 4.1 mmol/L (ref 3.5–5.1)
Sodium: 151 mmol/L — ABNORMAL HIGH (ref 135–145)
Total Bilirubin: 1 mg/dL (ref 0.3–1.2)
Total Protein: 6.3 g/dL — ABNORMAL LOW (ref 6.5–8.1)

## 2022-09-07 LAB — BODY FLUID CULTURE W GRAM STAIN: Gram Stain: NONE SEEN

## 2022-09-07 LAB — LIPASE, FLUID: Lipase-Fluid: 7 U/L

## 2022-09-07 NOTE — Progress Notes (Signed)
   Request made for percutaneous G tube for this pt  Imaging reviewed with Dr Mir Ascites is contraindication for percutaneous G tube Most recent paracentesis 4/29: 8.4 L  Cannot place in IR per Dr Mir  Corey Sciara - Sharren Bridge NP was notified and is aware Will cancel G tube and Ct Abd request

## 2022-09-08 ENCOUNTER — Other Ambulatory Visit (HOSPITAL_COMMUNITY): Payer: No Typology Code available for payment source

## 2022-09-08 DIAGNOSIS — G894 Chronic pain syndrome: Secondary | ICD-10-CM | POA: Diagnosis not present

## 2022-09-08 DIAGNOSIS — J918 Pleural effusion in other conditions classified elsewhere: Secondary | ICD-10-CM

## 2022-09-08 DIAGNOSIS — K769 Liver disease, unspecified: Secondary | ICD-10-CM

## 2022-09-08 DIAGNOSIS — K7031 Alcoholic cirrhosis of liver with ascites: Secondary | ICD-10-CM | POA: Diagnosis not present

## 2022-09-08 DIAGNOSIS — K7460 Unspecified cirrhosis of liver: Secondary | ICD-10-CM

## 2022-09-08 DIAGNOSIS — J9621 Acute and chronic respiratory failure with hypoxia: Secondary | ICD-10-CM

## 2022-09-08 LAB — BLOOD GAS, ARTERIAL
Acid-Base Excess: 8.8 mmol/L — ABNORMAL HIGH (ref 0.0–2.0)
Acid-Base Excess: 5.4 mmol/L — ABNORMAL HIGH (ref 0.0–2.0)
Bicarbonate: 31.3 mmol/L — ABNORMAL HIGH (ref 20.0–28.0)
Bicarbonate: 28.7 mmol/L — ABNORMAL HIGH (ref 20.0–28.0)
Drawn by: 164
O2 Saturation: 91.5 %
O2 Saturation: 100 %
Patient temperature: 37
Patient temperature: 37.7
pCO2 arterial: 35 mmHg (ref 32–48)
pCO2 arterial: 37 mmHg (ref 32–48)
pH, Arterial: 7.56 — ABNORMAL HIGH (ref 7.35–7.45)
pH, Arterial: 7.5 — ABNORMAL HIGH (ref 7.35–7.45)
pO2, Arterial: 59 mmHg — ABNORMAL LOW (ref 83–108)
pO2, Arterial: 157 mmHg — ABNORMAL HIGH (ref 83–108)

## 2022-09-08 LAB — CBC
HCT: 32.7 % — ABNORMAL LOW (ref 39.0–52.0)
Hemoglobin: 9.6 g/dL — ABNORMAL LOW (ref 13.0–17.0)
MCH: 28.2 pg (ref 26.0–34.0)
MCHC: 29.4 g/dL — ABNORMAL LOW (ref 30.0–36.0)
MCV: 95.9 fL (ref 80.0–100.0)
Platelets: 137 10*3/uL — ABNORMAL LOW (ref 150–400)
RBC: 3.41 MIL/uL — ABNORMAL LOW (ref 4.22–5.81)
RDW: 26 % — ABNORMAL HIGH (ref 11.5–15.5)
WBC: 10.3 10*3/uL (ref 4.0–10.5)
nRBC: 0.8 % — ABNORMAL HIGH (ref 0.0–0.2)

## 2022-09-08 LAB — LACTIC ACID, PLASMA: Lactic Acid, Venous: 2.1 mmol/L (ref 0.5–1.9)

## 2022-09-08 LAB — COMPREHENSIVE METABOLIC PANEL
ALT: 63 U/L — ABNORMAL HIGH (ref 0–44)
AST: 53 U/L — ABNORMAL HIGH (ref 15–41)
Albumin: 2.2 g/dL — ABNORMAL LOW (ref 3.5–5.0)
Alkaline Phosphatase: 695 U/L — ABNORMAL HIGH (ref 38–126)
Anion gap: 12 (ref 5–15)
BUN: 113 mg/dL — ABNORMAL HIGH (ref 8–23)
CO2: 25 mmol/L (ref 22–32)
Calcium: 8.3 mg/dL — ABNORMAL LOW (ref 8.9–10.3)
Chloride: 114 mmol/L — ABNORMAL HIGH (ref 98–111)
Creatinine, Ser: 0.85 mg/dL (ref 0.61–1.24)
GFR, Estimated: >60 mL/min (ref >60)
Glucose, Bld: 246 mg/dL — ABNORMAL HIGH (ref 70–99)
Potassium: 4.3 mmol/L (ref 3.5–5.1)
Sodium: 151 mmol/L — ABNORMAL HIGH (ref 135–145)
Total Bilirubin: 0.8 mg/dL (ref 0.3–1.2)
Total Protein: 6 g/dL — ABNORMAL LOW (ref 6.5–8.1)

## 2022-09-08 LAB — AMMONIA: Ammonia: 37 umol/L — ABNORMAL HIGH (ref 9–35)

## 2022-09-08 LAB — MISC LABCORP TEST (SEND OUT): Labcorp test code: 9985

## 2022-09-09 ENCOUNTER — Other Ambulatory Visit (HOSPITAL_COMMUNITY): Payer: No Typology Code available for payment source

## 2022-09-09 DIAGNOSIS — G894 Chronic pain syndrome: Secondary | ICD-10-CM | POA: Diagnosis not present

## 2022-09-09 DIAGNOSIS — K7460 Unspecified cirrhosis of liver: Secondary | ICD-10-CM | POA: Diagnosis not present

## 2022-09-09 DIAGNOSIS — J9621 Acute and chronic respiratory failure with hypoxia: Secondary | ICD-10-CM | POA: Diagnosis not present

## 2022-09-09 DIAGNOSIS — K7031 Alcoholic cirrhosis of liver with ascites: Secondary | ICD-10-CM | POA: Diagnosis not present

## 2022-09-09 LAB — CBC
HCT: 31.9 % — ABNORMAL LOW (ref 39.0–52.0)
Hemoglobin: 9.1 g/dL — ABNORMAL LOW (ref 13.0–17.0)
MCH: 28.1 pg (ref 26.0–34.0)
MCHC: 28.5 g/dL — ABNORMAL LOW (ref 30.0–36.0)
MCV: 98.5 fL (ref 80.0–100.0)
Platelets: 137 10*3/uL — ABNORMAL LOW (ref 150–400)
RBC: 3.24 MIL/uL — ABNORMAL LOW (ref 4.22–5.81)
RDW: 25.3 % — ABNORMAL HIGH (ref 11.5–15.5)
WBC: 11.2 10*3/uL — ABNORMAL HIGH (ref 4.0–10.5)
nRBC: 0.4 % — ABNORMAL HIGH (ref 0.0–0.2)

## 2022-09-09 LAB — BASIC METABOLIC PANEL
Anion gap: 7 (ref 5–15)
BUN: 119 mg/dL — ABNORMAL HIGH (ref 8–23)
CO2: 28 mmol/L (ref 22–32)
Calcium: 8.4 mg/dL — ABNORMAL LOW (ref 8.9–10.3)
Chloride: 116 mmol/L — ABNORMAL HIGH (ref 98–111)
Creatinine, Ser: 0.99 mg/dL (ref 0.61–1.24)
GFR, Estimated: >60 mL/min (ref >60)
Glucose, Bld: 217 mg/dL — ABNORMAL HIGH (ref 70–99)
Potassium: 3.7 mmol/L (ref 3.5–5.1)
Sodium: 151 mmol/L — ABNORMAL HIGH (ref 135–145)

## 2022-09-09 LAB — BLOOD GAS, ARTERIAL
Acid-Base Excess: 6.4 mmol/L — ABNORMAL HIGH (ref 0.0–2.0)
Bicarbonate: 29.4 mmol/L — ABNORMAL HIGH (ref 20.0–28.0)
O2 Saturation: 99.6 %
Patient temperature: 37.1
pCO2 arterial: 36 mmHg (ref 32–48)
pH, Arterial: 7.52 — ABNORMAL HIGH (ref 7.35–7.45)
pO2, Arterial: 140 mmHg — ABNORMAL HIGH (ref 83–108)

## 2022-09-09 LAB — CULTURE, RESPIRATORY W GRAM STAIN

## 2022-09-10 DIAGNOSIS — K7031 Alcoholic cirrhosis of liver with ascites: Secondary | ICD-10-CM | POA: Insufficient documentation

## 2022-09-10 DIAGNOSIS — J9621 Acute and chronic respiratory failure with hypoxia: Secondary | ICD-10-CM | POA: Diagnosis not present

## 2022-09-10 DIAGNOSIS — K7460 Unspecified cirrhosis of liver: Secondary | ICD-10-CM | POA: Insufficient documentation

## 2022-09-10 DIAGNOSIS — J918 Pleural effusion in other conditions classified elsewhere: Secondary | ICD-10-CM | POA: Insufficient documentation

## 2022-09-10 DIAGNOSIS — G894 Chronic pain syndrome: Secondary | ICD-10-CM | POA: Diagnosis not present

## 2022-09-10 LAB — CBC
HCT: 29.2 % — ABNORMAL LOW (ref 39.0–52.0)
Hemoglobin: 8.5 g/dL — ABNORMAL LOW (ref 13.0–17.0)
MCH: 28.3 pg (ref 26.0–34.0)
MCHC: 29.1 g/dL — ABNORMAL LOW (ref 30.0–36.0)
MCV: 97.3 fL (ref 80.0–100.0)
Platelets: 107 10*3/uL — ABNORMAL LOW (ref 150–400)
RBC: 3 MIL/uL — ABNORMAL LOW (ref 4.22–5.81)
RDW: 24.4 % — ABNORMAL HIGH (ref 11.5–15.5)
WBC: 5.9 10*3/uL (ref 4.0–10.5)
nRBC: 0 % (ref 0.0–0.2)

## 2022-09-10 LAB — COMPREHENSIVE METABOLIC PANEL
ALT: 41 U/L (ref 0–44)
AST: 29 U/L (ref 15–41)
Albumin: 2 g/dL — ABNORMAL LOW (ref 3.5–5.0)
Alkaline Phosphatase: 451 U/L — ABNORMAL HIGH (ref 38–126)
Anion gap: 7 (ref 5–15)
BUN: 123 mg/dL — ABNORMAL HIGH (ref 8–23)
CO2: 25 mmol/L (ref 22–32)
Calcium: 8.1 mg/dL — ABNORMAL LOW (ref 8.9–10.3)
Chloride: 117 mmol/L — ABNORMAL HIGH (ref 98–111)
Creatinine, Ser: 1.01 mg/dL (ref 0.61–1.24)
GFR, Estimated: >60 mL/min (ref >60)
Glucose, Bld: 272 mg/dL — ABNORMAL HIGH (ref 70–99)
Potassium: 3.6 mmol/L (ref 3.5–5.1)
Sodium: 149 mmol/L — ABNORMAL HIGH (ref 135–145)
Total Bilirubin: 0.9 mg/dL (ref 0.3–1.2)
Total Protein: 5.5 g/dL — ABNORMAL LOW (ref 6.5–8.1)

## 2022-09-10 LAB — VANCOMYCIN, TROUGH
Vancomycin Tr: 22 ug/mL (ref 15–20)
Vancomycin Tr: 22 ug/mL (ref 15–20)

## 2022-09-10 LAB — PROTIME-INR
INR: 1.4 — ABNORMAL HIGH (ref 0.8–1.2)
Prothrombin Time: 17.4 seconds — ABNORMAL HIGH (ref 11.4–15.2)

## 2022-09-10 LAB — MAGNESIUM: Magnesium: 3.4 mg/dL — ABNORMAL HIGH (ref 1.7–2.4)

## 2022-09-10 NOTE — Consult Note (Signed)
Pulmonary Critical Care Medicine [facility] [place]  PULMONARY SERVICE  Date of Service: 09/08/2022  PULMONARY CRITICAL CARE CONSULT   Corey Silva  XBM:[id number]  DOB: [birth year]   DOA: 09/01/2022  Referring Physician: Luna Kitchens, MD  HPI: Corey Silva is a 62 y.o. male seen for follow up of Acute on Chronic Respiratory Failure.  Patient has multiple medical problems including hyperlipidemia coronary artery disease type 2 diabetes metabolic encephalopathy cirrhosis of the liver came into the hospital became bedbound with a stage IV decubitus ulcer.  Patient came in again for decompensated cirrhosis of the liver.  According to the wife patient was possibly going to be considered for TIPS procedure.  He was however managed conservatively it appears with recurrence paracentesis.  He has had recurrent large-volume paracentesis up to 9.5 L last 1 was done here.  Patient now recently has developed increased respiratory distress and he has been significantly hypoxic we are asked to see the patient for management.  Review of Systems:  ROS performed and is unremarkable other than noted above.  Past Medical History:  Diagnosis Date   Cirrhosis (HCC)    Coronary artery disease    Diabetes mellitus without complication (HCC)    Hypertension     Past Surgical History:  Procedure Laterality Date   IR PARACENTESIS  09/04/2022   LEFT HEART CATH AND CORONARY ANGIOGRAPHY N/A 08/05/2019   Procedure: LEFT HEART CATH AND CORONARY ANGIOGRAPHY;  Surgeon: [place], Peter M, MD;  Location: [facility] INVASIVE CV LAB;  Service: Cardiovascular;  Laterality: N/A;    Social History:    reports that he has never smoked. He has never used smokeless tobacco. He reports that he does not currently use alcohol. He reports that he does not use drugs.  Family History: Non-Contributory to the present illness  Allergies  Allergen Reactions   Pseudoephedrine Swelling    Medications: Reviewed on  Rounds  Physical Exam:  Vitals: Temperature is 97 pulse 90 respiratory rate was 30  Ventilator Settings patient was on high flow oxygen  General: Comfortable at this time Eyes: Grossly normal lids, irises & conjunctiva ENT: grossly tongue is normal Neck: no obvious mass Cardiovascular: S1-S2 normal no gallop Respiratory: Diminished breath sounds at the bases Abdomen: Distended but soft Skin: no rash seen on limited exam Musculoskeletal: not rigid Psychiatric:unable to assess Neurologic: no seizure no involuntary movements         Labs on Admission:  Basic Metabolic Panel: Recent Labs  Lab 09/05/22 1237 09/07/22 0920 09/08/22 1144 09/09/22 0122 09/10/22 0127  NA 154* 151* 151* 151* 149*  K 3.9 4.1 4.3 3.7 3.6  CL 114* 115* 114* 116* 117*  CO2 29 28 25 28 25   GLUCOSE 314* 259* 246* 217* 272*  BUN 87* 97* 113* 119* 123*  CREATININE 0.67 0.73 0.85 0.99 1.01  CALCIUM 8.3* 8.5* 8.3* 8.4* 8.1*  MG  --   --   --   --  3.4*    Recent Labs  Lab 09/08/22 1340 09/08/22 1555 09/09/22 0730  PHART 7.56* 7.5* 7.52*  PCO2ART 35 37 36  PO2ART 59* 157* 140*  HCO3 31.3* 28.7* 29.4*  O2SAT 91.5 100 99.6    Liver Function Tests: Recent Labs  Lab 09/05/22 1237 09/07/22 0920 09/08/22 1144 09/10/22 0127  AST 167* 84* 53* 29  ALT 130* 86* 63* 41  ALKPHOS 1,036* 805* 695* 451*  BILITOT 1.1 1.0 0.8 0.9  PROT 5.6* 6.3* 6.0* 5.5*  ALBUMIN 2.1* 2.2* 2.2* 2.0*  No results for input(s): "LIPASE", "AMYLASE" in the last 168 hours. Recent Labs  Lab 09/08/22 1144  AMMONIA 37*    CBC: Recent Labs  Lab 09/05/22 1108 09/08/22 1144 09/09/22 0122 09/10/22 0127  WBC 8.8 10.3 11.2* 5.9  HGB 8.6* 9.6* 9.1* 8.5*  HCT 30.2* 32.7* 31.9* 29.2*  MCV 97.1 95.9 98.5 97.3  PLT 167 137* 137* 107*    Cardiac Enzymes: No results for input(s): "CKTOTAL", "CKMB", "CKMBINDEX", "TROPONINI" in the last 168 hours.  BNP (last 3 results) No results for input(s): "BNP" in the last 8760  hours.  ProBNP (last 3 results) No results for input(s): "PROBNP" in the last 8760 hours.   Radiological Exams on Admission: DG Chest Port 1 View  Result Date: 09/09/2022 CLINICAL DATA:  Respiratory failure. EXAM: PORTABLE CHEST 1 VIEW COMPARISON:  09/08/2022 FINDINGS: Feeding tube extends into the abdomen but the tip is beyond the image. Right arm PICC line tip is near the superior cavoatrial junction is stable. Mild elevation of the right hemidiaphragm. Slightly improved aeration of the left lung base compared to the recent comparison examination. Low lung volumes with probable bibasilar atelectasis. No new airspace disease or consolidation. No overt pulmonary edema. Heart size is stable and minimally changed. IMPRESSION: Low lung volumes with bibasilar atelectasis. Slightly improved aeration at the left lung base compared to the recent comparison examination. Electronically Signed   By: Richarda Overlie M.D.   On: 09/09/2022 12:08   DG Abd Portable 1V  Result Date: 09/08/2022 CLINICAL DATA:  NG tube placement.  Ileus.  SBO. EXAM: PORTABLE ABDOMEN - 1 VIEW COMPARISON:  KUB 09/03/2022. FINDINGS: The enteric catheter tip terminates in the distal stomach in the region of the pylorus. There is a relative paucity of bowel gas in the abdomen without evidence of mechanical obstruction. There is no definite free intraperitoneal air, within the confines of supine . IMPRESSION: Enteric catheter tip in the distal stomach in the region of the pylorus. Electronically Signed   By: Lesia Hausen M.D.   On: 09/08/2022 11:29   DG CHEST PORT 1 VIEW  Result Date: 09/08/2022 CLINICAL DATA:  Aspiration pneumonia EXAM: PORTABLE CHEST 1 VIEW COMPARISON:  Chest radiograph 09/02/2022. FINDINGS: The enteric catheter courses below the field of view. The right upper extremity PICC tip is stable terminating in the region of the cavoatrial junction. The cardiomediastinal silhouette is grossly stable. There are probable small bilateral  pleural effusions with bibasilar airspace opacities, left worse than right. Overall, aeration is unchanged. There is no new or worsening focal airspace disease. There is no appreciable pneumothorax There is no acute osseous abnormality. IMPRESSION: Probable small bilateral pleural effusions with adjacent airspace opacities, left worse than right, unchanged. No new or worsening focal airspace disease. Electronically Signed   By: Lesia Hausen M.D.   On: 09/08/2022 11:28    Assessment/Plan Active Problems:   Acute on chronic respiratory failure with hypoxia (HCC)   Advanced hepatic cirrhosis (HCC)   Alcoholic cirrhosis of liver with ascites (HCC)   Pleural effusion associated with hepatic disorder   Chronic pain syndrome   Acute on chronic respiratory failure hypoxia likely as decompensation of his respiratory status secondary to the ascites with subsequent increased work of breathing.  Placed him on BiPAP and we will monitor the patient's oxygenation and blood gases.  If he is responding to BiPAP he will need to be intubated.  I have spoken to the wife at the bedside at length and she would like to  proceed with aggressive measures. Decompensated hepatic cirrhosis.  Patient had been according to the wife been considered for possibility of TIPS this was not done however during his last admission.  It may be worthwhile to contact the transferring facility to see if that is still an option. Chronic pain syndrome pain management patient does have severe decubitus ulcer will continue with wound care management. Pleural effusion likely related to the underlying ascites at this time I do not think there is enough fluid to do a thoracentesis but we will continue to monitor with follow-up x-rays. Ascites patient has had large volumes over over the course of his disease and has required paracentesis  I have personally seen and evaluated the patient, evaluated laboratory and imaging results, formulated the  assessment and plan and placed orders. The Patient requires high complexity decision making with multiple systems involvement.  Case was discussed on Rounds with the Respiratory Therapy Director and the Respiratory staff Time Spent  Yevonne Pax, MD [facility] Pulmonary Critical Care Medicine Sleep Medicine

## 2022-09-11 ENCOUNTER — Other Ambulatory Visit (HOSPITAL_COMMUNITY): Payer: No Typology Code available for payment source

## 2022-09-11 DIAGNOSIS — K7031 Alcoholic cirrhosis of liver with ascites: Secondary | ICD-10-CM | POA: Diagnosis not present

## 2022-09-11 DIAGNOSIS — G894 Chronic pain syndrome: Secondary | ICD-10-CM | POA: Diagnosis not present

## 2022-09-11 DIAGNOSIS — K7460 Unspecified cirrhosis of liver: Secondary | ICD-10-CM | POA: Diagnosis not present

## 2022-09-11 DIAGNOSIS — J9621 Acute and chronic respiratory failure with hypoxia: Secondary | ICD-10-CM | POA: Diagnosis not present

## 2022-09-11 HISTORY — PX: IR PARACENTESIS: IMG2679

## 2022-09-11 LAB — CBC
HCT: 28 % — ABNORMAL LOW (ref 39.0–52.0)
Hemoglobin: 8.7 g/dL — ABNORMAL LOW (ref 13.0–17.0)
MCH: 28.8 pg (ref 26.0–34.0)
MCHC: 31.1 g/dL (ref 30.0–36.0)
MCV: 92.7 fL (ref 80.0–100.0)
Platelets: 84 10*3/uL — ABNORMAL LOW (ref 150–400)
RBC: 3.02 MIL/uL — ABNORMAL LOW (ref 4.22–5.81)
RDW: 23.3 % — ABNORMAL HIGH (ref 11.5–15.5)
WBC: 6.8 10*3/uL (ref 4.0–10.5)
nRBC: 0.3 % — ABNORMAL HIGH (ref 0.0–0.2)

## 2022-09-11 LAB — CULTURE, RESPIRATORY W GRAM STAIN

## 2022-09-11 LAB — BASIC METABOLIC PANEL
Anion gap: 10 (ref 5–15)
BUN: 131 mg/dL — ABNORMAL HIGH (ref 8–23)
CO2: 24 mmol/L (ref 22–32)
Calcium: 8.1 mg/dL — ABNORMAL LOW (ref 8.9–10.3)
Chloride: 113 mmol/L — ABNORMAL HIGH (ref 98–111)
Creatinine, Ser: 0.92 mg/dL (ref 0.61–1.24)
GFR, Estimated: >60 mL/min (ref >60)
Glucose, Bld: 212 mg/dL — ABNORMAL HIGH (ref 70–99)
Potassium: 3.8 mmol/L (ref 3.5–5.1)
Sodium: 147 mmol/L — ABNORMAL HIGH (ref 135–145)

## 2022-09-11 NOTE — Procedures (Signed)
Ultrasound-guided therapeutic paracentesis performed yielding 4.9 liters of dark brownish green colored fluid. No immediate complications. EBL is none.

## 2022-09-12 ENCOUNTER — Other Ambulatory Visit (HOSPITAL_COMMUNITY): Payer: No Typology Code available for payment source

## 2022-09-12 DIAGNOSIS — K7460 Unspecified cirrhosis of liver: Secondary | ICD-10-CM | POA: Diagnosis not present

## 2022-09-12 DIAGNOSIS — K7031 Alcoholic cirrhosis of liver with ascites: Secondary | ICD-10-CM | POA: Diagnosis not present

## 2022-09-12 DIAGNOSIS — G894 Chronic pain syndrome: Secondary | ICD-10-CM | POA: Diagnosis not present

## 2022-09-12 DIAGNOSIS — J9621 Acute and chronic respiratory failure with hypoxia: Secondary | ICD-10-CM | POA: Diagnosis not present

## 2022-09-12 LAB — COMPREHENSIVE METABOLIC PANEL
ALT: UNDETERMINED U/L (ref 0–44)
AST: UNDETERMINED U/L (ref 15–41)
Albumin: 2.2 g/dL — ABNORMAL LOW (ref 3.5–5.0)
Alkaline Phosphatase: 346 U/L — ABNORMAL HIGH (ref 38–126)
Anion gap: 11 (ref 5–15)
BUN: 139 mg/dL — ABNORMAL HIGH (ref 8–23)
CO2: 20 mmol/L — ABNORMAL LOW (ref 22–32)
Calcium: 8.3 mg/dL — ABNORMAL LOW (ref 8.9–10.3)
Chloride: 113 mmol/L — ABNORMAL HIGH (ref 98–111)
Creatinine, Ser: 0.86 mg/dL (ref 0.61–1.24)
GFR, Estimated: >60 mL/min (ref >60)
Glucose, Bld: 201 mg/dL — ABNORMAL HIGH (ref 70–99)
Potassium: 4.3 mmol/L (ref 3.5–5.1)
Sodium: 144 mmol/L (ref 135–145)
Total Bilirubin: UNDETERMINED mg/dL (ref 0.3–1.2)
Total Protein: 5 g/dL — ABNORMAL LOW (ref 6.5–8.1)

## 2022-09-12 LAB — CBC
HCT: 27.9 % — ABNORMAL LOW (ref 39.0–52.0)
Hemoglobin: 8.3 g/dL — ABNORMAL LOW (ref 13.0–17.0)
MCH: 28.1 pg (ref 26.0–34.0)
MCHC: 29.7 g/dL — ABNORMAL LOW (ref 30.0–36.0)
MCV: 94.6 fL (ref 80.0–100.0)
Platelets: 81 10*3/uL — ABNORMAL LOW (ref 150–400)
RBC: 2.95 MIL/uL — ABNORMAL LOW (ref 4.22–5.81)
RDW: 22.1 % — ABNORMAL HIGH (ref 11.5–15.5)
WBC: 5.3 10*3/uL (ref 4.0–10.5)
nRBC: 0 % (ref 0.0–0.2)

## 2022-09-12 LAB — BILIRUBIN, TOTAL: Total Bilirubin: 0.8 mg/dL (ref 0.3–1.2)

## 2022-09-12 LAB — ALT: ALT: 30 U/L (ref 0–44)

## 2022-09-12 LAB — AST: AST: 44 U/L — ABNORMAL HIGH (ref 15–41)

## 2022-09-12 LAB — MAGNESIUM: Magnesium: 3.4 mg/dL — ABNORMAL HIGH (ref 1.7–2.4)

## 2022-09-12 NOTE — Progress Notes (Addendum)
Pulmonary Critical Care Medicine [facility] [place]   PULMONARY CRITICAL CARE SERVICE  PROGRESS NOTE     Corey Silva  [id number]  DOB: 1961-04-27   DOA: 09/01/2022  Referring Physician: Luna Kitchens, MD  HPI: Corey Silva is a 62 y.o. male being followed for ventilator/airway/oxygen weaning Acute on Chronic Respiratory Failure.  Patient seen lying in bed, currently down to 6L [place].   Medications: Reviewed on Rounds  Physical Exam:  Vitals: Temp 97.3, pulse 60, respiration 29, BP 109/63, SpO2 99%   Ventilator Settings BiPAP 16/6 FiO2 60%  General: Comfortable at this time Neck: supple Cardiovascular: no malignant arrhythmias Skin: no rash seen on limited exam Resp: Bilaterally coarse Musculoskeletal: No gross abnormality Psychiatric:unable to assess Neurologic:no involuntary movements         Lab Data:   Basic Metabolic Panel: Recent Labs  Lab 09/08/22 1144 09/09/22 0122 09/10/22 0127 09/11/22 0201 09/12/22 0222  NA 151* 151* 149* 147* 144  K 4.3 3.7 3.6 3.8 4.3  CL 114* 116* 117* 113* 113*  CO2 25 28 25 24  20*  GLUCOSE 246* 217* 272* 212* 201*  BUN 113* 119* 123* 131* 139*  CREATININE 0.85 0.99 1.01 0.92 0.86  CALCIUM 8.3* 8.4* 8.1* 8.1* 8.3*  MG  --   --  3.4*  --   --      ABG: Recent Labs  Lab 09/08/22 1340 09/08/22 1555 09/09/22 0730  PHART 7.56* 7.5* 7.52*  PCO2ART 35 37 36  PO2ART 59* 157* 140*  HCO3 31.3* 28.7* 29.4*  O2SAT 91.5 100 99.6     Liver Function Tests: Recent Labs  Lab 09/05/22 1237 09/07/22 0920 09/08/22 1144 09/10/22 0127 09/12/22 0222  AST 167* 84* 53* 29 QUANTITY NOT SUFFICIENT, UNABLE TO PERFORM TEST  ALT 130* 86* 63* 41 QUANTITY NOT SUFFICIENT, UNABLE TO PERFORM TEST  ALKPHOS 1,036* 805* 695* 451* 346*  BILITOT 1.1 1.0 0.8 0.9 QUANTITY NOT SUFFICIENT, UNABLE TO PERFORM TEST  PROT 5.6* 6.3* 6.0* 5.5* 5.0*  ALBUMIN 2.1* 2.2* 2.2* 2.0* 2.2*    No results for input(s): "LIPASE", "AMYLASE" in the  last 168 hours. Recent Labs  Lab 09/08/22 1144  AMMONIA 37*     CBC: Recent Labs  Lab 09/08/22 1144 09/09/22 0122 09/10/22 0127 09/11/22 0316 09/12/22 0222  WBC 10.3 11.2* 5.9 6.8 5.3  HGB 9.6* 9.1* 8.5* 8.7* 8.3*  HCT 32.7* 31.9* 29.2* 28.0* 27.9*  MCV 95.9 98.5 97.3 92.7 94.6  PLT 137* 137* 107* 84* 81*     Cardiac Enzymes: No results for input(s): "CKTOTAL", "CKMB", "CKMBINDEX", "TROPONINI" in the last 168 hours.  BNP (last 3 results) No results for input(s): "BNP" in the last 8760 hours.  ProBNP (last 3 results) No results for input(s): "PROBNP" in the last 8760 hours.  Radiological Exams: IR Paracentesis  Result Date: 09/11/2022 INDICATION: 62 year old male history of cirrhosis with recurrent ascites. Request is for therapeutic paracentesis EXAM: ULTRASOUND GUIDED THERAPEUTIC LEFT-SIDED PARACENTESIS MEDICATIONS: Lidocaine 1% 10 mL. COMPLICATIONS: None immediate. PROCEDURE: Informed written consent was obtained from the patient after a discussion of the risks, benefits and alternatives to treatment. A timeout was performed prior to the initiation of the procedure. Initial ultrasound scanning demonstrates a moderate amount of ascites within the right lower abdominal quadrant. The right lower abdomen was prepped and draped in the usual sterile fashion. 1% lidocaine was used for local anesthesia. Following this, a 19 gauge, 7-cm, Yueh catheter was introduced. An ultrasound image was saved for documentation purposes. The paracentesis  was performed. The catheter was removed and a dressing was applied. The patient tolerated the procedure well without immediate post procedural complication. FINDINGS: A total of approximately 4.9 L of brownish green fluid was removed. IMPRESSION: Successful ultrasound-guided left-sided therapeutic paracentesis yielding 4.9 liters of peritoneal fluid. Read by: Anders Grant, NP Electronically Signed   By: Malachy Moan M.D.   On: 09/11/2022  14:44    Assessment/Plan Active Problems:   Acute on chronic respiratory failure with hypoxia (HCC)   Advanced hepatic cirrhosis (HCC)   Alcoholic cirrhosis of liver with ascites (HCC)   Pleural effusion associated with hepatic disorder   Chronic pain syndrome   Acute on chronic respiratory failure hypoxia likely as decompensation of his respiratory status secondary to the ascites with subsequent increased work of breathing. Down from bipap to 6L [place] Decompensated hepatic cirrhosis.  Patient had been according to the wife been considered for possibility of TIPS this was not done however during his last admission.  It may be worthwhile to contact the transferring facility to see if that is still an option. Chronic pain syndrome pain management patient does have severe decubitus ulcer will continue with wound care management. Pleural effusion likely related to the underlying ascites at this time I do not think there is enough fluid to do a thoracentesis but we will continue to monitor with follow-up x-rays. Ascites patient has had large volumes over over the course of his disease and has required paracentesis   I have personally seen and evaluated the patient, evaluated laboratory and imaging results, formulated the assessment and plan and placed orders. The Patient requires high complexity decision making with multiple systems involvement.  Rounds were done with the Respiratory Therapy Director and Staff therapists and discussed with nursing staff also.  Yevonne Pax, MD [facility] Pulmonary Critical Care Medicine Sleep Medicine

## 2022-09-12 NOTE — Progress Notes (Addendum)
Pulmonary Critical Care Medicine [facility] [place]   PULMONARY CRITICAL CARE SERVICE  PROGRESS NOTE     Corey Silva  [id number]:[id number]  DOB: Mar 23, 1961   DOA: 09/01/2022  Referring Physician: Luna Kitchens, MD  HPI: Corey Silva is a 62 y.o. male being followed for ventilator/airway/oxygen weaning Acute on Chronic Respiratory Failure.  Patient seen lying in bed, currently on 2-3 [place]. Patient awaiting paracentesis today.  Medications: Reviewed on Rounds  Physical Exam:  Vitals: Temp 96.0, pulse 52, respiration 34, BP 124/52, SpO2 90%   Ventilator Settings [place] 2-3L  General: Comfortable at this time Neck: supple Cardiovascular: no malignant arrhythmias Skin: no rash seen on limited exam Resp: Bilaterally coarse Musculoskeletal: No gross abnormality Psychiatric:unable to assess Neurologic:no involuntary movements         Lab Data:   Basic Metabolic Panel: Recent Labs  Lab 09/08/22 1144 09/09/22 0122 09/10/22 0127 09/11/22 0201 09/12/22 0222  NA 151* 151* 149* 147* 144  K 4.3 3.7 3.6 3.8 4.3  CL 114* 116* 117* 113* 113*  CO2 25 28 25 24  20*  GLUCOSE 246* 217* 272* 212* 201*  BUN 113* 119* 123* 131* 139*  CREATININE 0.85 0.99 1.01 0.92 0.86  CALCIUM 8.3* 8.4* 8.1* 8.1* 8.3*  MG  --   --  3.4*  --   --      ABG: Recent Labs  Lab 09/08/22 1340 09/08/22 1555 09/09/22 0730  PHART 7.56* 7.5* 7.52*  PCO2ART 35 37 36  PO2ART 59* 157* 140*  HCO3 31.3* 28.7* 29.4*  O2SAT 91.5 100 99.6     Liver Function Tests: Recent Labs  Lab 09/05/22 1237 09/07/22 0920 09/08/22 1144 09/10/22 0127 09/12/22 0222  AST 167* 84* 53* 29 QUANTITY NOT SUFFICIENT, UNABLE TO PERFORM TEST  ALT 130* 86* 63* 41 QUANTITY NOT SUFFICIENT, UNABLE TO PERFORM TEST  ALKPHOS 1,036* 805* 695* 451* 346*  BILITOT 1.1 1.0 0.8 0.9 QUANTITY NOT SUFFICIENT, UNABLE TO PERFORM TEST  PROT 5.6* 6.3* 6.0* 5.5* 5.0*  ALBUMIN 2.1* 2.2* 2.2* 2.0* 2.2*    No results for input(s):  "LIPASE", "AMYLASE" in the last 168 hours. Recent Labs  Lab 09/08/22 1144  AMMONIA 37*     CBC: Recent Labs  Lab 09/08/22 1144 09/09/22 0122 09/10/22 0127 09/11/22 0316 09/12/22 0222  WBC 10.3 11.2* 5.9 6.8 5.3  HGB 9.6* 9.1* 8.5* 8.7* 8.3*  HCT 32.7* 31.9* 29.2* 28.0* 27.9*  MCV 95.9 98.5 97.3 92.7 94.6  PLT 137* 137* 107* 84* 81*     Cardiac Enzymes: No results for input(s): "CKTOTAL", "CKMB", "CKMBINDEX", "TROPONINI" in the last 168 hours.  BNP (last 3 results) No results for input(s): "BNP" in the last 8760 hours.  ProBNP (last 3 results) No results for input(s): "PROBNP" in the last 8760 hours.  Radiological Exams: IR Paracentesis  Result Date: 09/11/2022 INDICATION: 62 year old male history of cirrhosis with recurrent ascites. Request is for therapeutic paracentesis EXAM: ULTRASOUND GUIDED THERAPEUTIC LEFT-SIDED PARACENTESIS MEDICATIONS: Lidocaine 1% 10 mL. COMPLICATIONS: None immediate. PROCEDURE: Informed written consent was obtained from the patient after a discussion of the risks, benefits and alternatives to treatment. A timeout was performed prior to the initiation of the procedure. Initial ultrasound scanning demonstrates a moderate amount of ascites within the right lower abdominal quadrant. The right lower abdomen was prepped and draped in the usual sterile fashion. 1% lidocaine was used for local anesthesia. Following this, a 19 gauge, 7-cm, Yueh catheter was introduced. An ultrasound image was saved for documentation purposes. The paracentesis  was performed. The catheter was removed and a dressing was applied. The patient tolerated the procedure well without immediate post procedural complication. FINDINGS: A total of approximately 4.9 L of brownish green fluid was removed. IMPRESSION: Successful ultrasound-guided left-sided therapeutic paracentesis yielding 4.9 liters of peritoneal fluid. Read by: Anders Grant, NP Electronically Signed   By: Malachy Moan M.D.   On: 09/11/2022 14:44    Assessment/Plan Active Problems:   Acute on chronic respiratory failure with hypoxia (HCC)   Advanced hepatic cirrhosis (HCC)   Alcoholic cirrhosis of liver with ascites (HCC)   Pleural effusion associated with hepatic disorder   Chronic pain syndrome   Acute on chronic respiratory failure hypoxia likely as decompensation of his respiratory status secondary to the ascites with subsequent increased work of breathing. Remains on 2-3L [place]. Decompensated hepatic cirrhosis.  Patient had been according to the wife been considered for possibility of TIPS this was not done however during his last admission.  It may be worthwhile to contact the transferring facility to see if that is still an option. Chronic pain syndrome pain management patient does have severe decubitus ulcer will continue with wound care management. Pleural effusion likely related to the underlying ascites at this time I do not think there is enough fluid to do a thoracentesis but we will continue to monitor with follow-up x-rays. Ascites patient has had large volumes over over the course of his disease and has required paracentesis   I have personally seen and evaluated the patient, evaluated laboratory and imaging results, formulated the assessment and plan and placed orders. The Patient requires high complexity decision making with multiple systems involvement.  Rounds were done with the Respiratory Therapy Director and Staff therapists and discussed with nursing staff also.  Yevonne Pax, MD [facility] Pulmonary Critical Care Medicine Sleep Medicine

## 2022-09-12 NOTE — Progress Notes (Addendum)
Pulmonary Critical Care Medicine [facility] [place]   PULMONARY CRITICAL CARE SERVICE  PROGRESS NOTE     Corey Silva  YQM:[id number]  DOB: [birth year]   DOA: 09/01/2022  Referring Physician: Luna Kitchens, MD  HPI: Corey Silva is a 62 y.o. male being followed for ventilator/airway/oxygen weaning Acute on Chronic Respiratory Failure.  Patient seen lying in bed, currently on continuous BiPAP 16 over 60%.  Patient awaiting paracentesis.  Medications: Reviewed on Rounds  Physical Exam:  Vitals: Temp 98.7, pulse 100, respiration 71, BP 155/75, SpO2 93%   Ventilator Settings BiPAP 16/6 FiO2 60%  General: Comfortable at this time Neck: supple Cardiovascular: no malignant arrhythmias Skin: no rash seen on limited exam Resp: Bilaterally coarse Musculoskeletal: No gross abnormality Psychiatric:unable to assess Neurologic:no involuntary movements         Lab Data:   Basic Metabolic Panel: Recent Labs  Lab 09/08/22 1144 09/09/22 0122 09/10/22 0127 09/11/22 0201 09/12/22 0222  NA 151* 151* 149* 147* 144  K 4.3 3.7 3.6 3.8 4.3  CL 114* 116* 117* 113* 113*  CO2 25 28 25 24  20*  GLUCOSE 246* 217* 272* 212* 201*  BUN 113* 119* 123* 131* 139*  CREATININE 0.85 0.99 1.01 0.92 0.86  CALCIUM 8.3* 8.4* 8.1* 8.1* 8.3*  MG  --   --  3.4*  --   --     ABG: Recent Labs  Lab 09/08/22 1340 09/08/22 1555 09/09/22 0730  PHART 7.56* 7.5* 7.52*  PCO2ART 35 37 36  PO2ART 59* 157* 140*  HCO3 31.3* 28.7* 29.4*  O2SAT 91.5 100 99.6    Liver Function Tests: Recent Labs  Lab 09/05/22 1237 09/07/22 0920 09/08/22 1144 09/10/22 0127 09/12/22 0222  AST 167* 84* 53* 29 QUANTITY NOT SUFFICIENT, UNABLE TO PERFORM TEST  ALT 130* 86* 63* 41 QUANTITY NOT SUFFICIENT, UNABLE TO PERFORM TEST  ALKPHOS 1,036* 805* 695* 451* 346*  BILITOT 1.1 1.0 0.8 0.9 QUANTITY NOT SUFFICIENT, UNABLE TO PERFORM TEST  PROT 5.6* 6.3* 6.0* 5.5* 5.0*  ALBUMIN 2.1* 2.2* 2.2* 2.0* 2.2*   No  results for input(s): "LIPASE", "AMYLASE" in the last 168 hours. Recent Labs  Lab 09/08/22 1144  AMMONIA 37*    CBC: Recent Labs  Lab 09/08/22 1144 09/09/22 0122 09/10/22 0127 09/11/22 0316 09/12/22 0222  WBC 10.3 11.2* 5.9 6.8 5.3  HGB 9.6* 9.1* 8.5* 8.7* 8.3*  HCT 32.7* 31.9* 29.2* 28.0* 27.9*  MCV 95.9 98.5 97.3 92.7 94.6  PLT 137* 137* 107* 84* 81*    Cardiac Enzymes: No results for input(s): "CKTOTAL", "CKMB", "CKMBINDEX", "TROPONINI" in the last 168 hours.  BNP (last 3 results) No results for input(s): "BNP" in the last 8760 hours.  ProBNP (last 3 results) No results for input(s): "PROBNP" in the last 8760 hours.  Radiological Exams: IR Paracentesis  Result Date: 09/11/2022 INDICATION: 62 year old male history of cirrhosis with recurrent ascites. Request is for therapeutic paracentesis EXAM: ULTRASOUND GUIDED THERAPEUTIC LEFT-SIDED PARACENTESIS MEDICATIONS: Lidocaine 1% 10 mL. COMPLICATIONS: None immediate. PROCEDURE: Informed written consent was obtained from the patient after a discussion of the risks, benefits and alternatives to treatment. A timeout was performed prior to the initiation of the procedure. Initial ultrasound scanning demonstrates a moderate amount of ascites within the right lower abdominal quadrant. The right lower abdomen was prepped and draped in the usual sterile fashion. 1% lidocaine was used for local anesthesia. Following this, a 19 gauge, 7-cm, Yueh catheter was introduced. An ultrasound image was saved for documentation purposes. The paracentesis  was performed. The catheter was removed and a dressing was applied. The patient tolerated the procedure well without immediate post procedural complication. FINDINGS: A total of approximately 4.9 L of brownish green fluid was removed. IMPRESSION: Successful ultrasound-guided left-sided therapeutic paracentesis yielding 4.9 liters of peritoneal fluid. Read by: Anders Grant, NP Electronically Signed    By: Malachy Moan M.D.   On: 09/11/2022 14:44    Assessment/Plan Active Problems:   Acute on chronic respiratory failure with hypoxia (HCC)   Advanced hepatic cirrhosis (HCC)   Alcoholic cirrhosis of liver with ascites (HCC)   Pleural effusion associated with hepatic disorder   Chronic pain syndrome   Acute on chronic respiratory failure hypoxia likely as decompensation of his respiratory status secondary to the ascites with subsequent increased work of breathing.  Placed him on BiPAP and we will monitor the patient's oxygenation and blood gases.  Decompensated hepatic cirrhosis.  Patient had been according to the wife been considered for possibility of TIPS this was not done however during his last admission.  It may be worthwhile to contact the transferring facility to see if that is still an option. Chronic pain syndrome pain management patient does have severe decubitus ulcer will continue with wound care management. Pleural effusion likely related to the underlying ascites at this time I do not think there is enough fluid to do a thoracentesis but we will continue to monitor with follow-up x-rays. Ascites patient has had large volumes over over the course of his disease and has required paracentesis   I have personally seen and evaluated the patient, evaluated laboratory and imaging results, formulated the assessment and plan and placed orders. The Patient requires high complexity decision making with multiple systems involvement.  Rounds were done with the Respiratory Therapy Director and Staff therapists and discussed with nursing staff also.  Yevonne Pax, MD [facility] Pulmonary Critical Care Medicine Sleep Medicine

## 2022-09-12 NOTE — Progress Notes (Addendum)
Pulmonary Critical Care Medicine [facility] [place]   PULMONARY CRITICAL CARE SERVICE  PROGRESS NOTE     Corey Silva  [id number]  DOB: [birth year]   DOA: 09/01/2022  Referring Physician: Luna Kitchens, MD  HPI: Corey Silva is a 62 y.o. male being followed for ventilator/airway/oxygen weaning Acute on Chronic Respiratory Failure.  Patient seen lying in bed, currently on 3 [place]. Patient underwent paracentesis yesterday.  Medications: Reviewed on Rounds  Physical Exam:  Vitals: Temp 97.1, pulse 83, respirations 37, BP 132/46, SpO2 100%  Ventilator Settings [place] 2-3L  General: Comfortable at this time Neck: supple Cardiovascular: no malignant arrhythmias Skin: no rash seen on limited exam Resp: Bilaterally coarse Musculoskeletal: No gross abnormality Psychiatric:unable to assess Neurologic:no involuntary movements         Lab Data:   Basic Metabolic Panel: Recent Labs  Lab 09/08/22 1144 09/09/22 0122 09/10/22 0127 09/11/22 0201 09/12/22 0222  NA 151* 151* 149* 147* 144  K 4.3 3.7 3.6 3.8 4.3  CL 114* 116* 117* 113* 113*  CO2 25 28 25 24  20*  GLUCOSE 246* 217* 272* 212* 201*  BUN 113* 119* 123* 131* 139*  CREATININE 0.85 0.99 1.01 0.92 0.86  CALCIUM 8.3* 8.4* 8.1* 8.1* 8.3*  MG  --   --  3.4*  --   --      ABG: Recent Labs  Lab 09/08/22 1340 09/08/22 1555 09/09/22 0730  PHART 7.56* 7.5* 7.52*  PCO2ART 35 37 36  PO2ART 59* 157* 140*  HCO3 31.3* 28.7* 29.4*  O2SAT 91.5 100 99.6     Liver Function Tests: Recent Labs  Lab 09/05/22 1237 09/07/22 0920 09/08/22 1144 09/10/22 0127 09/12/22 0222  AST 167* 84* 53* 29 QUANTITY NOT SUFFICIENT, UNABLE TO PERFORM TEST  ALT 130* 86* 63* 41 QUANTITY NOT SUFFICIENT, UNABLE TO PERFORM TEST  ALKPHOS 1,036* 805* 695* 451* 346*  BILITOT 1.1 1.0 0.8 0.9 QUANTITY NOT SUFFICIENT, UNABLE TO PERFORM TEST  PROT 5.6* 6.3* 6.0* 5.5* 5.0*  ALBUMIN 2.1* 2.2* 2.2* 2.0* 2.2*    No results for input(s):  "LIPASE", "AMYLASE" in the last 168 hours. Recent Labs  Lab 09/08/22 1144  AMMONIA 37*     CBC: Recent Labs  Lab 09/08/22 1144 09/09/22 0122 09/10/22 0127 09/11/22 0316 09/12/22 0222  WBC 10.3 11.2* 5.9 6.8 5.3  HGB 9.6* 9.1* 8.5* 8.7* 8.3*  HCT 32.7* 31.9* 29.2* 28.0* 27.9*  MCV 95.9 98.5 97.3 92.7 94.6  PLT 137* 137* 107* 84* 81*     Cardiac Enzymes: No results for input(s): "CKTOTAL", "CKMB", "CKMBINDEX", "TROPONINI" in the last 168 hours.  BNP (last 3 results) No results for input(s): "BNP" in the last 8760 hours.  ProBNP (last 3 results) No results for input(s): "PROBNP" in the last 8760 hours.  Radiological Exams: IR Paracentesis  Result Date: 09/11/2022 INDICATION: 62 year old male history of cirrhosis with recurrent ascites. Request is for therapeutic paracentesis EXAM: ULTRASOUND GUIDED THERAPEUTIC LEFT-SIDED PARACENTESIS MEDICATIONS: Lidocaine 1% 10 mL. COMPLICATIONS: None immediate. PROCEDURE: Informed written consent was obtained from the patient after a discussion of the risks, benefits and alternatives to treatment. A timeout was performed prior to the initiation of the procedure. Initial ultrasound scanning demonstrates a moderate amount of ascites within the right lower abdominal quadrant. The right lower abdomen was prepped and draped in the usual sterile fashion. 1% lidocaine was used for local anesthesia. Following this, a 19 gauge, 7-cm, Yueh catheter was introduced. An ultrasound image was saved for documentation purposes. The paracentesis was  performed. The catheter was removed and a dressing was applied. The patient tolerated the procedure well without immediate post procedural complication. FINDINGS: A total of approximately 4.9 L of brownish green fluid was removed. IMPRESSION: Successful ultrasound-guided left-sided therapeutic paracentesis yielding 4.9 liters of peritoneal fluid. Read by: Anders Grant, NP Electronically Signed   By: Malachy Moan M.D.   On: 09/11/2022 14:44    Assessment/Plan Active Problems:   Acute on chronic respiratory failure with hypoxia (HCC)   Advanced hepatic cirrhosis (HCC)   Alcoholic cirrhosis of liver with ascites (HCC)   Pleural effusion associated with hepatic disorder   Chronic pain syndrome   Acute on chronic respiratory failure hypoxia likely as decompensation of his respiratory status secondary to the ascites with subsequent increased work of breathing. Remains on 3L [place].  Paracentesis completed yesterday and has helped with breathing. Decompensated hepatic cirrhosis.  Patient had been according to the wife been considered for possibility of TIPS this was not done however during his last admission.  It may be worthwhile to contact the transferring facility to see if that is still an option. Chronic pain syndrome pain management patient does have severe decubitus ulcer will continue with wound care management. Pleural effusion likely related to the underlying ascites at this time I do not think there is enough fluid to do a thoracentesis but we will continue to monitor with follow-up x-rays. Ascites patient has had large volumes over over the course of his disease and has required paracentesis.  Paracentesis done yesterday   I have personally seen and evaluated the patient, evaluated laboratory and imaging results, formulated the assessment and plan and placed orders. The Patient requires high complexity decision making with multiple systems involvement.  Rounds were done with the Respiratory Therapy Director and Staff therapists and discussed with nursing staff also.  Yevonne Pax, MD [facility] Pulmonary Critical Care Medicine Sleep Medicine

## 2022-09-13 DIAGNOSIS — J9621 Acute and chronic respiratory failure with hypoxia: Secondary | ICD-10-CM | POA: Diagnosis not present

## 2022-09-13 DIAGNOSIS — G894 Chronic pain syndrome: Secondary | ICD-10-CM | POA: Diagnosis not present

## 2022-09-13 DIAGNOSIS — K7460 Unspecified cirrhosis of liver: Secondary | ICD-10-CM | POA: Diagnosis not present

## 2022-09-13 DIAGNOSIS — K7031 Alcoholic cirrhosis of liver with ascites: Secondary | ICD-10-CM | POA: Diagnosis not present

## 2022-09-13 LAB — CBC
HCT: 26.1 % — ABNORMAL LOW (ref 39.0–52.0)
Hemoglobin: 7.8 g/dL — ABNORMAL LOW (ref 13.0–17.0)
MCH: 27.9 pg (ref 26.0–34.0)
MCHC: 29.9 g/dL — ABNORMAL LOW (ref 30.0–36.0)
MCV: 93.2 fL (ref 80.0–100.0)
Platelets: 87 10*3/uL — ABNORMAL LOW (ref 150–400)
RBC: 2.8 MIL/uL — ABNORMAL LOW (ref 4.22–5.81)
RDW: 22.3 % — ABNORMAL HIGH (ref 11.5–15.5)
WBC: 4.5 10*3/uL (ref 4.0–10.5)
nRBC: 0 % (ref 0.0–0.2)

## 2022-09-13 LAB — BASIC METABOLIC PANEL
Anion gap: 8 (ref 5–15)
BUN: 127 mg/dL — ABNORMAL HIGH (ref 8–23)
CO2: 25 mmol/L (ref 22–32)
Calcium: 8.7 mg/dL — ABNORMAL LOW (ref 8.9–10.3)
Chloride: 110 mmol/L (ref 98–111)
Creatinine, Ser: 0.78 mg/dL (ref 0.61–1.24)
GFR, Estimated: >60 mL/min (ref >60)
Glucose, Bld: 382 mg/dL — ABNORMAL HIGH (ref 70–99)
Potassium: 3.6 mmol/L (ref 3.5–5.1)
Sodium: 143 mmol/L (ref 135–145)

## 2022-09-13 LAB — MAGNESIUM: Magnesium: 3.1 mg/dL — ABNORMAL HIGH (ref 1.7–2.4)

## 2022-09-13 LAB — TRIGLYCERIDES: Triglycerides: 75 mg/dL (ref <150)

## 2022-09-13 LAB — PHOSPHORUS: Phosphorus: 3.5 mg/dL (ref 2.5–4.6)

## 2022-09-13 NOTE — Progress Notes (Addendum)
Pulmonary Critical Care Medicine [facility] [place]   PULMONARY CRITICAL CARE SERVICE  PROGRESS NOTE     Corey Silva  FIE:[id number]  DOB: [birth year]   DOA: 09/01/2022  Referring Physician: Luna Kitchens, MD  HPI: Corey Silva is a 62 y.o. male being followed for ventilator/airway/oxygen weaning Acute on Chronic Respiratory Failure.  Patient seen lying in bed, currently on 2L [place].  Continue BiPAP as needed  Medications: Reviewed on Rounds  Physical Exam:  Vitals: Temp 96.3, pulse 68, respirations 28, BP 109/67, SpO2 99%  Ventilator Settings [place] 2-3L  General: Comfortable at this time Neck: supple Cardiovascular: no malignant arrhythmias Skin: no rash seen on limited exam Resp: Bilaterally coarse Musculoskeletal: No gross abnormality Psychiatric:unable to assess Neurologic:no involuntary movements         Lab Data:   Basic Metabolic Panel: Recent Labs  Lab 09/09/22 0122 09/10/22 0127 09/11/22 0201 09/12/22 0222 09/12/22 0742 09/13/22 0143  NA 151* 149* 147* 144  --  143  K 3.7 3.6 3.8 4.3  --  3.6  CL 116* 117* 113* 113*  --  110  CO2 28 25 24  20*  --  25  GLUCOSE 217* 272* 212* 201*  --  382*  BUN 119* 123* 131* 139*  --  127*  CREATININE 0.99 1.01 0.92 0.86  --  0.78  CALCIUM 8.4* 8.1* 8.1* 8.3*  --  8.7*  MG  --  3.4*  --   --  3.4* 3.1*  PHOS  --   --   --   --   --  3.5     ABG: Recent Labs  Lab 09/08/22 1340 09/08/22 1555 09/09/22 0730  PHART 7.56* 7.5* 7.52*  PCO2ART 35 37 36  PO2ART 59* 157* 140*  HCO3 31.3* 28.7* 29.4*  O2SAT 91.5 100 99.6     Liver Function Tests: Recent Labs  Lab 09/07/22 0920 09/08/22 1144 09/10/22 0127 09/12/22 0222 09/12/22 0742  AST 84* 53* 29 QUANTITY NOT SUFFICIENT, UNABLE TO PERFORM TEST 44*  ALT 86* 63* 41 QUANTITY NOT SUFFICIENT, UNABLE TO PERFORM TEST 30  ALKPHOS 805* 695* 451* 346*  --   BILITOT 1.0 0.8 0.9 QUANTITY NOT SUFFICIENT, UNABLE TO PERFORM TEST 0.8  PROT 6.3* 6.0* 5.5* 5.0*   --   ALBUMIN 2.2* 2.2* 2.0* 2.2*  --     No results for input(s): "LIPASE", "AMYLASE" in the last 168 hours. Recent Labs  Lab 09/08/22 1144  AMMONIA 37*     CBC: Recent Labs  Lab 09/09/22 0122 09/10/22 0127 09/11/22 0316 09/12/22 0222 09/13/22 0143  WBC 11.2* 5.9 6.8 5.3 4.5  HGB 9.1* 8.5* 8.7* 8.3* 7.8*  HCT 31.9* 29.2* 28.0* 27.9* 26.1*  MCV 98.5 97.3 92.7 94.6 93.2  PLT 137* 107* 84* 81* 87*     Cardiac Enzymes: No results for input(s): "CKTOTAL", "CKMB", "CKMBINDEX", "TROPONINI" in the last 168 hours.  BNP (last 3 results) No results for input(s): "BNP" in the last 8760 hours.  ProBNP (last 3 results) No results for input(s): "PROBNP" in the last 8760 hours.  Radiological Exams: IR Paracentesis  Result Date: 09/11/2022 INDICATION: 62 year old male history of cirrhosis with recurrent ascites. Request is for therapeutic paracentesis EXAM: ULTRASOUND GUIDED THERAPEUTIC LEFT-SIDED PARACENTESIS MEDICATIONS: Lidocaine 1% 10 mL. COMPLICATIONS: None immediate. PROCEDURE: Informed written consent was obtained from the patient after a discussion of the risks, benefits and alternatives to treatment. A timeout was performed prior to the initiation of the procedure. Initial ultrasound scanning demonstrates a moderate  amount of ascites within the right lower abdominal quadrant. The right lower abdomen was prepped and draped in the usual sterile fashion. 1% lidocaine was used for local anesthesia. Following this, a 19 gauge, 7-cm, Yueh catheter was introduced. An ultrasound image was saved for documentation purposes. The paracentesis was performed. The catheter was removed and a dressing was applied. The patient tolerated the procedure well without immediate post procedural complication. FINDINGS: A total of approximately 4.9 L of brownish green fluid was removed. IMPRESSION: Successful ultrasound-guided left-sided therapeutic paracentesis yielding 4.9 liters of peritoneal fluid. Read by:  Anders Grant, NP Electronically Signed   By: Malachy Moan M.D.   On: 09/11/2022 14:44    Assessment/Plan Active Problems:   Acute on chronic respiratory failure with hypoxia (HCC)   Advanced hepatic cirrhosis (HCC)   Alcoholic cirrhosis of liver with ascites (HCC)   Pleural effusion associated with hepatic disorder   Chronic pain syndrome   Acute on chronic respiratory failure hypoxia likely as decompensation of his respiratory status secondary to the ascites with subsequent increased work of breathing. Remains on 2L [place].  Paracentesis completed earlier this week and has helped with breathing. Decompensated hepatic cirrhosis.  Patient had been according to the wife been considered for possibility of TIPS this was not done however during his last admission.  It may be worthwhile to contact the transferring facility to see if that is still an option. Chronic pain syndrome pain management patient does have severe decubitus ulcer will continue with wound care management. Pleural effusion likely related to the underlying ascites at this time I do not think there is enough fluid to do a thoracentesis but we will continue to monitor with follow-up x-rays. Ascites patient has had large volumes over over the course of his disease and has required paracentesis.  Paracentesis done yesterday   I have personally seen and evaluated the patient, evaluated laboratory and imaging results, formulated the assessment and plan and placed orders. The Patient requires high complexity decision making with multiple systems involvement.  Rounds were done with the Respiratory Therapy Director and Staff therapists and discussed with nursing staff also.  Yevonne Pax, MD [facility] Pulmonary Critical Care Medicine Sleep Medicine

## 2022-09-14 LAB — COMPREHENSIVE METABOLIC PANEL
ALT: 28 U/L (ref 0–44)
AST: 32 U/L (ref 15–41)
Albumin: 2.5 g/dL — ABNORMAL LOW (ref 3.5–5.0)
Alkaline Phosphatase: 263 U/L — ABNORMAL HIGH (ref 38–126)
Anion gap: 8 (ref 5–15)
BUN: 115 mg/dL — ABNORMAL HIGH (ref 8–23)
CO2: 25 mmol/L (ref 22–32)
Calcium: 9.2 mg/dL (ref 8.9–10.3)
Chloride: 113 mmol/L — ABNORMAL HIGH (ref 98–111)
Creatinine, Ser: 0.71 mg/dL (ref 0.61–1.24)
GFR, Estimated: >60 mL/min (ref >60)
Glucose, Bld: 402 mg/dL — ABNORMAL HIGH (ref 70–99)
Potassium: 4.3 mmol/L (ref 3.5–5.1)
Sodium: 146 mmol/L — ABNORMAL HIGH (ref 135–145)
Total Bilirubin: 0.7 mg/dL (ref 0.3–1.2)
Total Protein: 4.8 g/dL — ABNORMAL LOW (ref 6.5–8.1)

## 2022-09-14 LAB — MISC LABCORP TEST (SEND OUT)

## 2022-09-14 LAB — CBC
HCT: 23.7 % — ABNORMAL LOW (ref 39.0–52.0)
Hemoglobin: 7.4 g/dL — ABNORMAL LOW (ref 13.0–17.0)
MCH: 28.9 pg (ref 26.0–34.0)
MCHC: 31.2 g/dL (ref 30.0–36.0)
MCV: 92.6 fL (ref 80.0–100.0)
Platelets: 45 10*3/uL — ABNORMAL LOW (ref 150–400)
RBC: 2.56 MIL/uL — ABNORMAL LOW (ref 4.22–5.81)
RDW: 21.5 % — ABNORMAL HIGH (ref 11.5–15.5)
WBC: 3.9 10*3/uL — ABNORMAL LOW (ref 4.0–10.5)
nRBC: 0 % (ref 0.0–0.2)

## 2022-09-14 LAB — BPAM PLATELET PHERESIS: Unit Type and Rh: 6200

## 2022-09-14 LAB — AMMONIA: Ammonia: 16 umol/L (ref 9–35)

## 2022-09-15 DIAGNOSIS — G894 Chronic pain syndrome: Secondary | ICD-10-CM | POA: Diagnosis not present

## 2022-09-15 DIAGNOSIS — K7460 Unspecified cirrhosis of liver: Secondary | ICD-10-CM | POA: Diagnosis not present

## 2022-09-15 DIAGNOSIS — K7031 Alcoholic cirrhosis of liver with ascites: Secondary | ICD-10-CM | POA: Diagnosis not present

## 2022-09-15 DIAGNOSIS — J9621 Acute and chronic respiratory failure with hypoxia: Secondary | ICD-10-CM | POA: Diagnosis not present

## 2022-09-15 LAB — CBC
HCT: 26.5 % — ABNORMAL LOW (ref 39.0–52.0)
Hemoglobin: 7.6 g/dL — ABNORMAL LOW (ref 13.0–17.0)
MCH: 27.9 pg (ref 26.0–34.0)
MCHC: 28.7 g/dL — ABNORMAL LOW (ref 30.0–36.0)
MCV: 97.4 fL (ref 80.0–100.0)
Platelets: 77 10*3/uL — ABNORMAL LOW (ref 150–400)
RBC: 2.72 MIL/uL — ABNORMAL LOW (ref 4.22–5.81)
RDW: 21.9 % — ABNORMAL HIGH (ref 11.5–15.5)
WBC: 5.1 10*3/uL (ref 4.0–10.5)
nRBC: 0 % (ref 0.0–0.2)

## 2022-09-15 LAB — BPAM PLATELET PHERESIS

## 2022-09-15 LAB — COMPREHENSIVE METABOLIC PANEL
ALT: 32 U/L (ref 0–44)
AST: 32 U/L (ref 15–41)
Albumin: 2.6 g/dL — ABNORMAL LOW (ref 3.5–5.0)
Alkaline Phosphatase: 275 U/L — ABNORMAL HIGH (ref 38–126)
Anion gap: 6 (ref 5–15)
BUN: 109 mg/dL — ABNORMAL HIGH (ref 8–23)
CO2: 30 mmol/L (ref 22–32)
Calcium: 9 mg/dL (ref 8.9–10.3)
Chloride: 111 mmol/L (ref 98–111)
Creatinine, Ser: 0.61 mg/dL (ref 0.61–1.24)
GFR, Estimated: >60 mL/min (ref >60)
Glucose, Bld: 325 mg/dL — ABNORMAL HIGH (ref 70–99)
Potassium: 3.7 mmol/L (ref 3.5–5.1)
Sodium: 147 mmol/L — ABNORMAL HIGH (ref 135–145)
Total Bilirubin: 0.8 mg/dL (ref 0.3–1.2)
Total Protein: 5.3 g/dL — ABNORMAL LOW (ref 6.5–8.1)

## 2022-09-15 LAB — PREPARE PLATELET PHERESIS

## 2022-09-15 NOTE — Progress Notes (Addendum)
Pulmonary Critical Care Medicine [facility] [place]   PULMONARY CRITICAL CARE SERVICE  PROGRESS NOTE     [name]  ZOX:[id number]  DOB: 05/27/1960   DOA: 09/01/2022  Referring Physician: Luna Kitchens, MD  HPI: Corey Silva is a 62 y.o. male being followed for ventilator/airway/oxygen weaning Acute on Chronic Respiratory Failure.  Patient seen lying in bed, currently requiring anywhere from room air to 2 L nasal cannula.  Continue with BiPAP as needed.  Medications: Reviewed on Rounds  Physical Exam:  Vitals: Temp 9 97.1, pulse 95, respirations 26, BP 100/69, SpO2 100%  Ventilator Settings [place]- [place] 2L  General: Comfortable at this time Neck: supple Cardiovascular: no malignant arrhythmias Skin: no rash seen on limited exam Resp: Bilaterally coarse Musculoskeletal: No gross abnormality Psychiatric:unable to assess Neurologic:no involuntary movements         Lab Data:   Basic Metabolic Panel: Recent Labs  Lab 09/10/22 0127 09/11/22 0201 09/12/22 0222 09/12/22 0742 09/13/22 0143 09/14/22 1441  NA 149* 147* 144  --  143 146*  K 3.6 3.8 4.3  --  3.6 4.3  CL 117* 113* 113*  --  110 113*  CO2 25 24 20*  --  25 25  GLUCOSE 272* 212* 201*  --  382* 402*  BUN 123* 131* 139*  --  127* 115*  CREATININE 1.01 0.92 0.86  --  0.78 0.71  CALCIUM 8.1* 8.1* 8.3*  --  8.7* 9.2  MG 3.4*  --   --  3.4* 3.1*  --   PHOS  --   --   --   --  3.5  --      ABG: Recent Labs  Lab 09/08/22 1340 09/08/22 1555 09/09/22 0730  PHART 7.56* 7.5* 7.52*  PCO2ART 35 37 36  PO2ART 59* 157* 140*  HCO3 31.3* 28.7* 29.4*  O2SAT 91.5 100 99.6     Liver Function Tests: Recent Labs  Lab 09/08/22 1144 09/10/22 0127 09/12/22 0222 09/12/22 0742 09/14/22 1441  AST 53* 29 QUANTITY NOT SUFFICIENT, UNABLE TO PERFORM TEST 44* 32  ALT 63* 41 QUANTITY NOT SUFFICIENT, UNABLE TO PERFORM TEST 30 28  ALKPHOS 695* 451* 346*  --  263*  BILITOT 0.8 0.9 QUANTITY NOT SUFFICIENT, UNABLE  TO PERFORM TEST 0.8 0.7  PROT 6.0* 5.5* 5.0*  --  4.8*  ALBUMIN 2.2* 2.0* 2.2*  --  2.5*    No results for input(s): "LIPASE", "AMYLASE" in the last 168 hours. Recent Labs  Lab 09/08/22 1144 09/14/22 1812  AMMONIA 37* 16     CBC: Recent Labs  Lab 09/10/22 0127 09/11/22 0316 09/12/22 0222 09/13/22 0143 09/14/22 1558  WBC 5.9 6.8 5.3 4.5 3.9*  HGB 8.5* 8.7* 8.3* 7.8* 7.4*  HCT 29.2* 28.0* 27.9* 26.1* 23.7*  MCV 97.3 92.7 94.6 93.2 92.6  PLT 107* 84* 81* 87* 45*     Cardiac Enzymes: No results for input(s): "CKTOTAL", "CKMB", "CKMBINDEX", "TROPONINI" in the last 168 hours.  BNP (last 3 results) No results for input(s): "BNP" in the last 8760 hours.  ProBNP (last 3 results) No results for input(s): "PROBNP" in the last 8760 hours.  Radiological Exams: No results found.  Assessment/Plan Active Problems:   Acute on chronic respiratory failure with hypoxia (HCC)   Advanced hepatic cirrhosis (HCC)   Alcoholic cirrhosis of liver with ascites (HCC)   Pleural effusion associated with hepatic disorder   Chronic pain syndrome   Acute on chronic respiratory failure hypoxia likely as decompensation of his  respiratory status secondary to the ascites with subsequent increased work of breathing. Remains on 2L [place].  Paracentesis completed this week and has helped with breathing. Decompensated hepatic cirrhosis.  Patient had been according to the wife been considered for possibility of TIPS this was not done however during his last admission.  It may be worthwhile to contact the transferring facility to see if that is still an option. Chronic pain syndrome pain management patient does have severe decubitus ulcer will continue with wound care management. Pleural effusion likely related to the underlying ascites at this time I do not think there is enough fluid to do a thoracentesis but we will continue to monitor with follow-up x-rays. Ascites patient has had large volumes over over  the course of his disease and has required paracentesis.  Paracentesis done yesterday   I have personally seen and evaluated the patient, evaluated laboratory and imaging results, formulated the assessment and plan and placed orders. The Patient requires high complexity decision making with multiple systems involvement.  Rounds were done with the Respiratory Therapy Director and Staff therapists and discussed with nursing staff also.  Yevonne Pax, MD [facility] Pulmonary Critical Care Medicine Sleep Medicine

## 2022-09-16 LAB — PREPARE PLATELET PHERESIS: Unit division: 0

## 2022-09-16 LAB — MAGNESIUM: Magnesium: 2.4 mg/dL (ref 1.7–2.4)

## 2022-09-16 LAB — BPAM PLATELET PHERESIS
Blood Product Expiration Date: 202405102359
ISSUE DATE / TIME: 202405100050

## 2022-09-16 LAB — PHOSPHORUS: Phosphorus: 2.8 mg/dL (ref 2.5–4.6)

## 2022-09-17 LAB — COMPREHENSIVE METABOLIC PANEL
ALT: 66 U/L — ABNORMAL HIGH (ref 0–44)
AST: 132 U/L — ABNORMAL HIGH (ref 15–41)
Albumin: 2.4 g/dL — ABNORMAL LOW (ref 3.5–5.0)
Alkaline Phosphatase: 513 U/L — ABNORMAL HIGH (ref 38–126)
Anion gap: 7 (ref 5–15)
BUN: 105 mg/dL — ABNORMAL HIGH (ref 8–23)
CO2: 28 mmol/L (ref 22–32)
Calcium: 9.1 mg/dL (ref 8.9–10.3)
Chloride: 115 mmol/L — ABNORMAL HIGH (ref 98–111)
Creatinine, Ser: 0.6 mg/dL — ABNORMAL LOW (ref 0.61–1.24)
GFR, Estimated: >60 mL/min (ref >60)
Glucose, Bld: 366 mg/dL — ABNORMAL HIGH (ref 70–99)
Potassium: 3.6 mmol/L (ref 3.5–5.1)
Sodium: 150 mmol/L — ABNORMAL HIGH (ref 135–145)
Total Bilirubin: 1.1 mg/dL (ref 0.3–1.2)
Total Protein: 5.1 g/dL — ABNORMAL LOW (ref 6.5–8.1)

## 2022-09-17 LAB — CBC
HCT: 26.2 % — ABNORMAL LOW (ref 39.0–52.0)
Hemoglobin: 7.6 g/dL — ABNORMAL LOW (ref 13.0–17.0)
MCH: 28.1 pg (ref 26.0–34.0)
MCHC: 29 g/dL — ABNORMAL LOW (ref 30.0–36.0)
MCV: 97 fL (ref 80.0–100.0)
Platelets: 74 10*3/uL — ABNORMAL LOW (ref 150–400)
RBC: 2.7 MIL/uL — ABNORMAL LOW (ref 4.22–5.81)
RDW: 22.1 % — ABNORMAL HIGH (ref 11.5–15.5)
WBC: 4.6 10*3/uL (ref 4.0–10.5)
nRBC: 0 % (ref 0.0–0.2)

## 2022-09-18 ENCOUNTER — Other Ambulatory Visit (HOSPITAL_COMMUNITY): Payer: No Typology Code available for payment source

## 2022-09-18 ENCOUNTER — Encounter: Payer: Self-pay | Admitting: *Deleted

## 2022-09-18 HISTORY — PX: IR PARACENTESIS: IMG2679

## 2022-09-18 MED ORDER — LIDOCAINE HCL 1 % IJ SOLN
INTRAMUSCULAR | Status: AC
  Filled 2022-09-18: qty 20

## 2022-09-18 NOTE — Procedures (Signed)
Ultrasound-guided  therapeutic paracentesis performed yielding 9.1 liters of straw colored fluid. . No immediate complications. EBL is none.

## 2022-09-18 NOTE — Congregational Nurse Program (Signed)
[facility] for [facility] with the patient.  Patient is at the end of life and wanted prayers to make the right decision as to the future.  We spoke about what a dnr is and what a partial code is.  Patient does not want to be coded.  Prayers were given of support and that he follows his wishes.  There has been  push back bu the family.  Prayed that he has the strength to make what decision he wants.  Hs worried about leaving the family. Prayer that God would support and keep the family in his absence.  Patient seemed to be at ease after the prayers and talk.  Informed him that I could come back and visit with him again if he wanted.

## 2022-09-19 ENCOUNTER — Encounter: Payer: Self-pay | Admitting: *Deleted

## 2022-09-19 LAB — CBC
HCT: 24.6 % — ABNORMAL LOW (ref 39.0–52.0)
Hemoglobin: 7 g/dL — ABNORMAL LOW (ref 13.0–17.0)
MCH: 28.2 pg (ref 26.0–34.0)
MCHC: 28.5 g/dL — ABNORMAL LOW (ref 30.0–36.0)
MCV: 99.2 fL (ref 80.0–100.0)
Platelets: 69 10*3/uL — ABNORMAL LOW (ref 150–400)
RBC: 2.48 MIL/uL — ABNORMAL LOW (ref 4.22–5.81)
RDW: 21.6 % — ABNORMAL HIGH (ref 11.5–15.5)
WBC: 4.8 10*3/uL (ref 4.0–10.5)
nRBC: 0 % (ref 0.0–0.2)

## 2022-09-19 LAB — COMPREHENSIVE METABOLIC PANEL
ALT: 105 U/L — ABNORMAL HIGH (ref 0–44)
AST: 139 U/L — ABNORMAL HIGH (ref 15–41)
Albumin: 2.5 g/dL — ABNORMAL LOW (ref 3.5–5.0)
Alkaline Phosphatase: 758 U/L — ABNORMAL HIGH (ref 38–126)
Anion gap: 7 (ref 5–15)
BUN: 100 mg/dL — ABNORMAL HIGH (ref 8–23)
CO2: 27 mmol/L (ref 22–32)
Calcium: 9.1 mg/dL (ref 8.9–10.3)
Chloride: 117 mmol/L — ABNORMAL HIGH (ref 98–111)
Creatinine, Ser: 0.53 mg/dL — ABNORMAL LOW (ref 0.61–1.24)
GFR, Estimated: >60 mL/min (ref >60)
Glucose, Bld: 151 mg/dL — ABNORMAL HIGH (ref 70–99)
Potassium: 3.8 mmol/L (ref 3.5–5.1)
Sodium: 151 mmol/L — ABNORMAL HIGH (ref 135–145)
Total Bilirubin: 0.8 mg/dL (ref 0.3–1.2)
Total Protein: 4.6 g/dL — ABNORMAL LOW (ref 6.5–8.1)

## 2022-09-19 LAB — TYPE AND SCREEN

## 2022-09-19 LAB — BPAM RBC
Blood Product Expiration Date: 202406072359
Unit Type and Rh: 6200

## 2022-09-19 LAB — PHOSPHORUS: Phosphorus: 3.7 mg/dL (ref 2.5–4.6)

## 2022-09-19 LAB — PREPARE RBC (CROSSMATCH)

## 2022-09-19 LAB — MAGNESIUM: Magnesium: 2.2 mg/dL (ref 1.7–2.4)

## 2022-09-19 NOTE — Congregational Nurse Program (Signed)
Call received that patient asked for the congregational nurse to be called.  Visited patient in his room.  Wanted encouragement and prayers.  Wife was in room.  Patient would not talk about his end of life.  Noticed multiple new sores on patient and that the neck dressing was pulling off and taking tissue with it when the patient moved.  Staff alerted to this.  Patient still remains a partial code.  Wife wants everything but chest compressions done.  Patient was more alert today.  Thankful for the visit. Prayers and encouragement offered and given.

## 2022-09-20 LAB — COMPREHENSIVE METABOLIC PANEL
ALT: 71 U/L — ABNORMAL HIGH (ref 0–44)
AST: 59 U/L — ABNORMAL HIGH (ref 15–41)
Albumin: 2.5 g/dL — ABNORMAL LOW (ref 3.5–5.0)
Alkaline Phosphatase: 773 U/L — ABNORMAL HIGH (ref 38–126)
Anion gap: 9 (ref 5–15)
BUN: 100 mg/dL — ABNORMAL HIGH (ref 8–23)
CO2: 26 mmol/L (ref 22–32)
Calcium: 8.9 mg/dL (ref 8.9–10.3)
Chloride: 113 mmol/L — ABNORMAL HIGH (ref 98–111)
Creatinine, Ser: 0.57 mg/dL — ABNORMAL LOW (ref 0.61–1.24)
GFR, Estimated: >60 mL/min (ref >60)
Glucose, Bld: 246 mg/dL — ABNORMAL HIGH (ref 70–99)
Potassium: 3.8 mmol/L (ref 3.5–5.1)
Sodium: 148 mmol/L — ABNORMAL HIGH (ref 135–145)
Total Bilirubin: 1 mg/dL (ref 0.3–1.2)
Total Protein: 4.8 g/dL — ABNORMAL LOW (ref 6.5–8.1)

## 2022-09-20 LAB — CBC
HCT: 28.9 % — ABNORMAL LOW (ref 39.0–52.0)
Hemoglobin: 8.5 g/dL — ABNORMAL LOW (ref 13.0–17.0)
MCH: 28 pg (ref 26.0–34.0)
MCHC: 29.4 g/dL — ABNORMAL LOW (ref 30.0–36.0)
MCV: 95.1 fL (ref 80.0–100.0)
Platelets: 74 10*3/uL — ABNORMAL LOW (ref 150–400)
RBC: 3.04 MIL/uL — ABNORMAL LOW (ref 4.22–5.81)
RDW: 20.8 % — ABNORMAL HIGH (ref 11.5–15.5)
WBC: 5.8 10*3/uL (ref 4.0–10.5)
nRBC: 0 % (ref 0.0–0.2)

## 2022-09-20 LAB — BPAM RBC: ISSUE DATE / TIME: 202405141509

## 2022-09-20 LAB — TYPE AND SCREEN
ABO/RH(D): A POS
Antibody Screen: NEGATIVE
Unit division: 0

## 2022-09-20 LAB — TRIGLYCERIDES: Triglycerides: 63 mg/dL (ref <150)

## 2022-09-22 ENCOUNTER — Other Ambulatory Visit (HOSPITAL_COMMUNITY): Payer: No Typology Code available for payment source

## 2022-09-22 LAB — COMPREHENSIVE METABOLIC PANEL
ALT: 162 U/L — ABNORMAL HIGH (ref 0–44)
AST: 243 U/L — ABNORMAL HIGH (ref 15–41)
Albumin: 2.4 g/dL — ABNORMAL LOW (ref 3.5–5.0)
Alkaline Phosphatase: 1385 U/L — ABNORMAL HIGH (ref 38–126)
Anion gap: 8 (ref 5–15)
BUN: 104 mg/dL — ABNORMAL HIGH (ref 8–23)
CO2: 25 mmol/L (ref 22–32)
Calcium: 8.4 mg/dL — ABNORMAL LOW (ref 8.9–10.3)
Chloride: 111 mmol/L (ref 98–111)
Creatinine, Ser: 0.63 mg/dL (ref 0.61–1.24)
GFR, Estimated: >60 mL/min (ref >60)
Glucose, Bld: 303 mg/dL — ABNORMAL HIGH (ref 70–99)
Potassium: 4.7 mmol/L (ref 3.5–5.1)
Sodium: 144 mmol/L (ref 135–145)
Total Bilirubin: 1.6 mg/dL — ABNORMAL HIGH (ref 0.3–1.2)
Total Protein: 4.9 g/dL — ABNORMAL LOW (ref 6.5–8.1)

## 2022-09-22 LAB — PHOSPHORUS: Phosphorus: 4.6 mg/dL (ref 2.5–4.6)

## 2022-09-22 LAB — MAGNESIUM: Magnesium: 2.2 mg/dL (ref 1.7–2.4)

## 2022-09-22 MED ORDER — IOHEXOL 350 MG/ML SOLN
100.0000 mL | Freq: Once | INTRAVENOUS | Status: AC | PRN
  Administered 2022-09-22: 100 mL via INTRAVENOUS

## 2022-09-23 LAB — PROTIME-INR
INR: 1.5 — ABNORMAL HIGH (ref 0.8–1.2)
Prothrombin Time: 18.2 seconds — ABNORMAL HIGH (ref 11.4–15.2)

## 2022-09-24 LAB — COMPREHENSIVE METABOLIC PANEL
ALT: 156 U/L — ABNORMAL HIGH (ref 0–44)
AST: 162 U/L — ABNORMAL HIGH (ref 15–41)
Albumin: 2.1 g/dL — ABNORMAL LOW (ref 3.5–5.0)
Alkaline Phosphatase: 1354 U/L — ABNORMAL HIGH (ref 38–126)
Anion gap: 7 (ref 5–15)
BUN: 102 mg/dL — ABNORMAL HIGH (ref 8–23)
CO2: 24 mmol/L (ref 22–32)
Calcium: 8.3 mg/dL — ABNORMAL LOW (ref 8.9–10.3)
Chloride: 109 mmol/L (ref 98–111)
Creatinine, Ser: 0.65 mg/dL (ref 0.61–1.24)
GFR, Estimated: >60 mL/min (ref >60)
Glucose, Bld: 188 mg/dL — ABNORMAL HIGH (ref 70–99)
Potassium: 3.9 mmol/L (ref 3.5–5.1)
Sodium: 140 mmol/L (ref 135–145)
Total Bilirubin: 1.3 mg/dL — ABNORMAL HIGH (ref 0.3–1.2)
Total Protein: 4.7 g/dL — ABNORMAL LOW (ref 6.5–8.1)

## 2022-09-24 LAB — CBC WITH DIFFERENTIAL/PLATELET
Abs Immature Granulocytes: 0.03 10*3/uL (ref 0.00–0.07)
Basophils Absolute: 0 10*3/uL (ref 0.0–0.1)
Basophils Relative: 1 %
Eosinophils Absolute: 0.2 10*3/uL (ref 0.0–0.5)
Eosinophils Relative: 3 %
HCT: 29.3 % — ABNORMAL LOW (ref 39.0–52.0)
Hemoglobin: 8.7 g/dL — ABNORMAL LOW (ref 13.0–17.0)
Immature Granulocytes: 1 %
Lymphocytes Relative: 10 %
Lymphs Abs: 0.6 10*3/uL — ABNORMAL LOW (ref 0.7–4.0)
MCH: 28.2 pg (ref 26.0–34.0)
MCHC: 29.7 g/dL — ABNORMAL LOW (ref 30.0–36.0)
MCV: 94.8 fL (ref 80.0–100.0)
Monocytes Absolute: 0.5 10*3/uL (ref 0.1–1.0)
Monocytes Relative: 7 %
Neutro Abs: 4.9 10*3/uL (ref 1.7–7.7)
Neutrophils Relative %: 78 %
Platelets: 72 10*3/uL — ABNORMAL LOW (ref 150–400)
RBC: 3.09 MIL/uL — ABNORMAL LOW (ref 4.22–5.81)
RDW: 18.6 % — ABNORMAL HIGH (ref 11.5–15.5)
WBC: 6.3 10*3/uL (ref 4.0–10.5)
nRBC: 0 % (ref 0.0–0.2)

## 2022-09-25 ENCOUNTER — Other Ambulatory Visit (HOSPITAL_COMMUNITY): Payer: No Typology Code available for payment source

## 2022-09-25 HISTORY — PX: IR PARACENTESIS: IMG2679

## 2022-09-25 LAB — BODY FLUID CELL COUNT WITH DIFFERENTIAL
Eos, Fluid: 1 %
Lymphs, Fluid: 41 %
Monocyte-Macrophage-Serous Fluid: 31 % — ABNORMAL LOW (ref 50–90)
Neutrophil Count, Fluid: 27 % — ABNORMAL HIGH (ref 0–25)
Total Nucleated Cell Count, Fluid: 116 cu mm (ref 0–1000)

## 2022-09-25 LAB — PHOSPHORUS: Phosphorus: 5 mg/dL — ABNORMAL HIGH (ref 2.5–4.6)

## 2022-09-25 LAB — MAGNESIUM: Magnesium: 2.3 mg/dL (ref 1.7–2.4)

## 2022-09-25 MED ORDER — LIDOCAINE HCL 1 % IJ SOLN
INTRAMUSCULAR | Status: AC
  Filled 2022-09-25: qty 20

## 2022-09-25 NOTE — Progress Notes (Signed)
Attempted Echocardiogram, patient refused due to pain.

## 2022-09-25 NOTE — Procedures (Signed)
PROCEDURE SUMMARY:  Successful ultrasound guided paracentesis from the left lower quadrant.  Yielded 8.1 of serosanguinous  fluid.  No immediate complications.  The patient tolerated the procedure well.   Specimen was sent for labs.  EBL < 2 mL  The patient has previously been formally evaluated by the [facility] and is being actively followed for potential future intervention.

## 2022-09-26 ENCOUNTER — Other Ambulatory Visit (HOSPITAL_COMMUNITY): Payer: No Typology Code available for payment source

## 2022-09-26 LAB — AEROBIC/ANAEROBIC CULTURE W GRAM STAIN (SURGICAL/DEEP WOUND)

## 2022-09-26 NOTE — Progress Notes (Cosign Needed Addendum)
    TIPs ordered for IR consideration   Corey Silva [id number]-- has been admitted to [facility] for the past 3 weeks and undergone several large volume paras since admission.  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   He is at [facility] with chronic pain, debility, malnutrition/FTT, chronic respiratory failure intermittently on Bipap.    IR was consulted for G-tube, but declined due to ascites.  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 and given his current labs on [facility]). Continues to require frequent, large volume paras.   [facility] requesting TIPS.  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 embo vs. TIPS but he was never really stable enough for any of that to be truly pursued prior to transfer to [facility].    Pt has refused procedure for last 2-3 days I was asked to come to room and speak to wife She is room now and I have spoken to both pt and wife at this time I have answered many questions regarding TIPs procedure Pt is alert and aware of self; dob; place   Pt still states he is unwilling to move ahead.   I told he and his wife that we will ask again tomorrow--- if still not wanting procedure ----we will cancel procedure request --- MD can re order if pt is willing at that time to consider procedure   They both have good understanding of this plan and are agreeable  I also placed this note in [facility]

## 2022-09-26 NOTE — Progress Notes (Signed)
Attempted Echocardiogram. Per Wallie Char, patient refused echo at 8:33am.

## 2022-09-27 LAB — PATHOLOGIST SMEAR REVIEW

## 2022-09-27 LAB — TRIGLYCERIDES: Triglycerides: 53 mg/dL (ref <150)

## 2022-09-27 NOTE — Consult Note (Addendum)
Chief Complaint: Patient was seen in consultation today for Transjugular Intrahepatic Portal System Shunt Placement at the request of Dr Theora Gianotti   Supervising Physician: Gilmer Mor  Patient Status: [facility]  History of Present Illness: Corey Silva is a 62 y.o. male   FULL Code Status per wife Hx CAD; DM; HTN Known Hepatic Cirrhosis No alcohol since November 2023 per family at bedside Recurrent; refractory ascites Esophageal varices Most recent LVP: 09/25/22: 8.1 liters (4 large volume paras in the last month)  CT 09/21/21: IMPRESSION: VASCULAR 1. Portal venous hypertension, manifested by large volume ascites, splenomegaly, and extensive intra-abdominal and intrapelvic varices. Largest varices are seen within the distal thoracic esophagus. No evidence of active hemorrhage. 2.  Aortic Atherosclerosis (ICD10-I70.0). NON-VASCULAR 1. Cirrhosis, with sequela of portal venous hypertension as above. 2. Large volume ascites. Dependent soft tissue density within the pelvic ascites could reflect debris/blood products related to recent paracentesis, versus peritoneal thickening. This is new since prior study. No evidence of active intraperitoneal hemorrhage. 3. Bilateral pleural effusions, left greater than right, with compressive lower lobe atelectasis. 4. No evidence of active gastrointestinal hemorrhage.  MELD: 12 (labs from 5/19) Echo 11/02/21: EF 65-70 %; LV wnl Lasix 40 mg daily Lactulose 20 gr daily Aldactone 100 mg daily  Request mad for TIPs procedure in IR per Dr Theora Gianotti Per notes; wife has mentioned that pt was planned for TIPS at outside facility but never was pursued--- now IP at [facility] secondary Respiratory issues and hypoxia  Admitted 09/01/22 Pulmonology following in [facility]     Past Medical History:  Diagnosis Date   Cirrhosis (HCC)    Coronary artery disease    Diabetes mellitus without complication (HCC)    Hypertension     Past  Surgical History:  Procedure Laterality Date   IR PARACENTESIS  09/04/2022   IR PARACENTESIS  09/11/2022   IR PARACENTESIS  09/18/2022   IR PARACENTESIS  09/25/2022   LEFT HEART CATH AND CORONARY ANGIOGRAPHY N/A 08/05/2019   Procedure: LEFT HEART CATH AND CORONARY ANGIOGRAPHY;  Surgeon: [place], Peter M, MD;  Location: [facility] INVASIVE CV LAB;  Service: Cardiovascular;  Laterality: N/A;    Allergies: Pseudoephedrine  Medications: Prior to Admission medications   Medication Sig Start Date End Date Taking? Authorizing Provider  acetaminophen (TYLENOL) 500 MG tablet Take 1,000 mg by mouth every 6 (six) hours as needed for mild pain. Patient not taking: Reported on 10/25/2021    [provider]  Apoaequorin (PREVAGEN EXTRA STRENGTH) 20 MG CAPS Take 20 tablets by mouth daily.    [provider]  aspirin EC 81 MG tablet Take 81 mg by mouth at bedtime.    [provider]  Buprenorphine HCl 75 MCG FILM Take 75 mcg by mouth in the morning and at bedtime. 10/14/21   [provider]  busPIRone (BUSPAR) 10 MG tablet Take 20 mg by mouth 2 (two) times daily.     [provider]  docusate sodium (COLACE) 100 MG capsule Take 100 mg by mouth daily.    [provider]  docusate sodium (COLACE) 250 MG capsule Take 1 capsule (250 mg total) by mouth daily. 08/12/20   Carroll Sage, PA-C  DORZOLAMIDE HCL-TIMOLOL MAL OP Place 1 drop into the left eye daily.    [provider]  DULoxetine (CYMBALTA) 60 MG capsule Take 60 mg by mouth 2 (two) times daily.     [provider]  ferrous sulfate 325 (65 FE) MG  tablet Take 1 tablet (325 mg total) by mouth daily. Patient not taking: Reported on 10/25/2021 08/12/20   Carroll Sage, PA-C  gabapentin (NEURONTIN) 300 MG capsule Take 1,200 mg by mouth 3 (three) times daily.    [provider]  HUMULIN R U-500 KWIKPEN 500 UNIT/ML kwikpen Inject 50 Units into the skin 2 (two) times daily with a meal.  01/07/20   [provider]  hydrochlorothiazide (HYDRODIURIL) 25 MG tablet Take 25 mg by mouth every evening.    [provider]  isosorbide mononitrate (IMDUR) 60 MG 24 hr tablet Take 60 mg by mouth at bedtime.    [provider]  losartan (COZAAR) 100 MG tablet Take 100 mg by mouth at bedtime.    [provider]  metFORMIN (GLUCOPHAGE) 500 MG tablet Take 1,000 mg by mouth 2 (two) times daily. 07/20/21   [provider]  metFORMIN (GLUCOPHAGE-XR) 500 MG 24 hr tablet Take 2 tablets (1,000 mg total) by mouth in the morning and at bedtime. 08/08/19   [place], Peter M, MD  metoprolol succinate (TOPROL-XL) 100 MG 24 hr tablet Take 1 tablet (100 mg total) by mouth daily. Take with or immediately following a meal. 10/25/21 10/20/22  Little Ishikawa, MD  Multiple Vitamins-Minerals (MULTIVITAMIN WITH MINERALS) tablet Take 1 tablet by mouth daily.    [provider]  Multiple Vitamins-Minerals (PRESERVISION AREDS PO) Take 1 tablet by mouth in the morning and at bedtime.    [provider]  nitroGLYCERIN (NITROSTAT) 0.4 MG SL tablet Place 1 tablet (0.4 mg total) under the tongue every 5 (five) minutes as needed for chest pain. 10/25/21 01/23/22  Little Ishikawa, MD  omeprazole (PRILOSEC OTC) 20 MG tablet Take 20 mg by mouth daily.    [provider]  ondansetron (ZOFRAN-ODT) 4 MG disintegrating tablet Take 1 tablet (4 mg total) by mouth every 8 (eight) hours as needed for nausea or vomiting. Patient not taking: Reported on 10/25/2021 08/10/21   Honor Loh M, PA-C  OZEMPIC, 0.25 OR 0.5 MG/DOSE, 2 MG/1.5ML SOPN Inject 0.25 mg into the skin once a week. 01/07/20   [provider]  Polyethyl Glycol-Propyl Glycol (SYSTANE OP) Place 1 drop into both eyes 3 (three) times daily as needed (dry eye). Patient not taking: Reported on 10/25/2021    [provider]  pregabalin (LYRICA) 225 MG capsule Take 225 mg by mouth 2 (two)  times daily.     [provider]  rosuvastatin (CRESTOR) 40 MG tablet Take 40 mg by mouth every evening. Patient not taking: Reported on 10/25/2021    [provider]  tamsulosin (FLOMAX) 0.4 MG CAPS capsule Take 0.4 mg by mouth at bedtime.    [provider]  traZODone (DESYREL) 50 MG tablet Take 50 mg by mouth at bedtime. 09/27/21   [provider]  vitamin B-12 (CYANOCOBALAMIN) 500 MCG tablet Take 500 mcg by mouth daily. Patient not taking: Reported on 10/25/2021    [provider]  vitamin C (ASCORBIC ACID) 500 MG tablet Take 500 mg by mouth daily.    [provider]  Vitamin D, Cholecalciferol, 25 MCG (1000 UT) TABS Take 1,000 mg by mouth daily.     [provider]  XIIDRA 5 % SOLN Place 1 drop into both eyes 2 (two) times daily. 05/27/21   [provider]     No family history on file.  Social History   Socioeconomic History   Marital status: Married  Spouse name: Not on file   Number of children: Not on file   Years of education: Not on file   Highest education level: Not on file  Occupational History   Not on file  Tobacco Use   Smoking status: Never   Smokeless tobacco: Never  Substance and Sexual Activity   Alcohol use: Not Currently   Drug use: Never   Sexual activity: Yes  Other Topics Concern   Not on file  Social History Narrative   Not on file   Social Determinants of Health   Financial Resource Strain: Not on file  Food Insecurity: Not on file  Transportation Needs: Not on file  Physical Activity: Not on file  Stress: Not on file  Social Connections: Not on file    Review of Systems: A 12 point ROS discussed and pertinent positives are indicated in the HPI above.  All other systems are negative.  Review of Systems  Constitutional:  Positive for activity change, appetite change, fatigue and unexpected weight change. Negative for fever.  Respiratory:  Positive for cough and shortness  of breath. Negative for wheezing and stridor.   Cardiovascular:  Negative for chest pain and leg swelling.  Gastrointestinal:  Positive for abdominal distention, abdominal pain and nausea.  Musculoskeletal:  Positive for gait problem.  Neurological:  Positive for weakness.  Psychiatric/Behavioral:  Positive for confusion and decreased concentration. Negative for behavioral problems.     Vital Signs: BP (!) 102/59 (BP Location: Left Arm)     Physical Exam Vitals reviewed.  Constitutional:      Appearance: He is ill-appearing.     Comments: Frail/ weak  HENT:     Mouth/Throat:     Mouth: Mucous membranes are moist.  Cardiovascular:     Rate and Rhythm: Normal rate.     Heart sounds: Murmur heard.  Pulmonary:     Effort: Pulmonary effort is normal.     Breath sounds: No wheezing or rales.  Abdominal:     General: There is distension.     Palpations: Abdomen is soft.     Tenderness: There is abdominal tenderness.     Comments: Mild distension  Skin:    General: Skin is warm.     Coloration: Skin is not jaundiced.     Findings: Bruising present.  Neurological:     Mental Status: He is alert. Mental status is at baseline.     Comments: Pleasant; confused  Psychiatric:     Comments: Family at bedside     Imaging: DG CHEST PORT 1 VIEW  Result Date: 09/25/2022 CLINICAL DATA:  Short of breath EXAM: PORTABLE CHEST 1 VIEW COMPARISON:  09/09/2022 FINDINGS: Single frontal view of the chest demonstrates stable right PICC. Enteric catheter has been removed. The cardiac silhouette is unremarkable. There are progressive bibasilar veiling opacities. Increased central vascular congestion. No pneumothorax. No acute bony abnormalities. IMPRESSION: 1. Progressive bibasilar veiling opacities and increasing central vascular congestion, compatible with edema and worsening volume status. Electronically Signed   By: Sharlet Salina M.D.   On: 09/25/2022 16:50   IR Paracentesis  Result Date:  09/25/2022 INDICATION: 62 year old male. History of cirrhosis with recurrent ascites. Request is for therapeutic and diagnostic paracentesis EXAM: ULTRASOUND GUIDED THERAPEUTIC AND DIAGNOSTIC PARACENTESIS MEDICATIONS: LIDOCAINE 1% 10 ML COMPLICATIONS: None immediate. PROCEDURE: Informed written consent was obtained from the patient after a discussion of the risks, benefits and alternatives to treatment. A timeout was performed prior to the initiation of the procedure. Initial ultrasound  scanning demonstrates a large amount of ascites within the left lower abdominal quadrant. The left lower abdomen was prepped and draped in the usual sterile fashion. 1% lidocaine was used for local anesthesia. Following this, a 19 gauge, 7-cm, Yueh catheter was introduced. An ultrasound image was saved for documentation purposes. The paracentesis was performed. The catheter was removed and a dressing was applied. The patient tolerated the procedure well without immediate post procedural complication. Patient received post-procedure intravenous albumin; see nursing notes for details. FINDINGS: A total of approximately 8.1 L of serosanguineous fluid was removed. Samples were sent to the laboratory as requested by the clinical team. Patient was unable to tolerate additional fluid removal due to low blood pressure. IMPRESSION: Successful ultrasound-guided paracentesis yielding 8.1 liters of peritoneal fluid. Performed by: Anders Grant, NP PLAN: The patient has previously been formally evaluated by the [facility] and is being actively followed for potential future intervention. Electronically Signed   By: Richarda Overlie M.D.   On: 09/25/2022 16:05   CT Angio Abd/Pel w/ and/or w/o  Result Date: 09/22/2022 CLINICAL DATA:  Esophageal varices CTA BRTO protocol for h/o varices, cirrhosis, recurrent ascites EXAM: CTA ABDOMEN AND PELVIS WITHOUT AND WITH CONTRAST TECHNIQUE: Multidetector  CT imaging of the abdomen and pelvis was performed using the standard protocol during bolus administration of intravenous contrast. Multiplanar reconstructed images and MIPs were obtained and reviewed to evaluate the vascular anatomy. RADIATION DOSE REDUCTION: This exam was performed according to the departmental dose-optimization program which includes automated exposure control, adjustment of the mA and/or kV according to patient size and/or use of iterative reconstruction technique. CONTRAST:  OMNIPAQUE IOHEXOL 350 MG/ML SOLN COMPARISON:  04/19/2022 FINDINGS: VASCULAR Aorta: Normal caliber aorta without aneurysm, dissection, vasculitis or significant stenosis. Diffuse atherosclerosis. Celiac: Patent without evidence of aneurysm, dissection, vasculitis or significant stenosis. Diffuse atherosclerosis. SMA: Patent without evidence of aneurysm, dissection, vasculitis or significant stenosis. Diffuse atherosclerosis, with mild narrowing at the origin of the SMA estimated at less than 50%. Renals: Both renal arteries are patent without evidence of aneurysm, dissection, vasculitis, fibromuscular dysplasia or significant stenosis. IMA: Patent without evidence of aneurysm, dissection, vasculitis or significant stenosis. Inflow: Patent without evidence of aneurysm, dissection, vasculitis or significant stenosis. Moderate atherosclerosis without critical stenosis. Proximal Outflow: Bilateral common femoral and visualized portions of the superficial and profunda femoral arteries are patent without evidence of aneurysm, dissection, vasculitis or significant stenosis. Moderate atherosclerosis without critical stenosis. Veins: There is extrinsic narrowing of the IVC due to enlargement the caudate lobe of the liver. The portal vein, SMV, and splenic vein are patent. Numerous venous collaterals are seen within the abdomen and pelvis consistent with portal venous hypertension. Small splenic, gastric, and rectal varices are  noted. There are large esophageal varices identified. I do not see any evidence of contrast extravasation to suggest active hemorrhage. Review of the MIP images confirms the above findings. NON-VASCULAR Lower chest: Small bilateral pleural effusions, left greater than right, with compressive bilateral lower lobe atelectasis. Hepatobiliary: Cirrhotic morphology of the liver. No focal parenchymal liver abnormality. Gallbladder is surgically absent. No biliary duct dilation. Pancreas: Unremarkable. No pancreatic ductal dilatation or surrounding inflammatory changes. Spleen: Splenomegaly consistent with portal venous hypertension. No focal parenchymal abnormality. Adrenals/Urinary Tract: Adrenal glands are unremarkable. Kidneys are normal, without renal calculi, focal lesion, or hydronephrosis. Bladder is unremarkable. Stomach/Bowel: No bowel obstruction or ileus. Normal appendix right lower quadrant. No bowel wall thickening or inflammatory change. No intraluminal accumulation of contrast to  suggest active gastrointestinal hemorrhage. Lymphatic: No pathologic adenopathy within the abdomen or pelvis. Reproductive: Prostate is not enlarged. Penile prosthesis again noted. Other: Large volume ascites throughout the abdomen and pelvis. Dependent soft tissue density is seen within the pelvic ascites, which could reflect debris/blood products related to recent paracentesis versus peritoneal thickening. This is new since previous CT. No evidence of active intraperitoneal hemorrhage. No free intraperitoneal gas.  No abdominal wall hernia. Musculoskeletal: No acute or destructive bony abnormalities. IMPRESSION: VASCULAR 1. Portal venous hypertension, manifested by large volume ascites, splenomegaly, and extensive intra-abdominal and intrapelvic varices. Largest varices are seen within the distal thoracic esophagus. No evidence of active hemorrhage. 2.  Aortic Atherosclerosis (ICD10-I70.0). NON-VASCULAR 1. Cirrhosis, with sequela  of portal venous hypertension as above. 2. Large volume ascites. Dependent soft tissue density within the pelvic ascites could reflect debris/blood products related to recent paracentesis, versus peritoneal thickening. This is new since prior study. No evidence of active intraperitoneal hemorrhage. 3. Bilateral pleural effusions, left greater than right, with compressive lower lobe atelectasis. 4. No evidence of active gastrointestinal hemorrhage. Electronically Signed   By: Sharlet Salina M.D.   On: 09/22/2022 17:16   IR Paracentesis  Result Date: 09/18/2022 INDICATION: 62 year old male history of cirrhosis with recurrent ascites. Request is for therapeutic paracentesis EXAM: ULTRASOUND GUIDED THERAPEUTIC PARACENTESIS MEDICATIONS: None. COMPLICATIONS: None immediate. PROCEDURE: Informed written consent was obtained from the patient after a discussion of the risks, benefits and alternatives to treatment. A timeout was performed prior to the initiation of the procedure. Initial ultrasound scanning demonstrates a large amount of ascites within the left lower abdominal quadrant. The left lower abdomen was prepped and draped in the usual sterile fashion. 1% lidocaine was used for local anesthesia. Following this, a 19 gauge, 7-cm, Yueh catheter was introduced. An ultrasound image was saved for documentation purposes. The paracentesis was performed. The catheter was removed and a dressing was applied. The patient tolerated the procedure well without immediate post procedural complication. FINDINGS: A total of approximately 9.1 L of straw-colored fluid was removed. IMPRESSION: Successful ultrasound-guided therapeutic left-sided paracentesis yielding 9.1 liters of peritoneal fluid. Performed by: Anders Grant, NP Electronically Signed   By: Corlis Leak M.D.   On: 09/18/2022 17:03   IR Paracentesis  Result Date: 09/11/2022 INDICATION: 62 year old male history of cirrhosis with recurrent ascites. Request is for  therapeutic paracentesis EXAM: ULTRASOUND GUIDED THERAPEUTIC LEFT-SIDED PARACENTESIS MEDICATIONS: Lidocaine 1% 10 mL. COMPLICATIONS: None immediate. PROCEDURE: Informed written consent was obtained from the patient after a discussion of the risks, benefits and alternatives to treatment. A timeout was performed prior to the initiation of the procedure. Initial ultrasound scanning demonstrates a moderate amount of ascites within the right lower abdominal quadrant. The right lower abdomen was prepped and draped in the usual sterile fashion. 1% lidocaine was used for local anesthesia. Following this, a 19 gauge, 7-cm, Yueh catheter was introduced. An ultrasound image was saved for documentation purposes. The paracentesis was performed. The catheter was removed and a dressing was applied. The patient tolerated the procedure well without immediate post procedural complication. FINDINGS: A total of approximately 4.9 L of brownish green fluid was removed. IMPRESSION: Successful ultrasound-guided left-sided therapeutic paracentesis yielding 4.9 liters of peritoneal fluid. Read by: Anders Grant, NP Electronically Signed   By: Malachy Moan M.D.   On: 09/11/2022 14:44   DG Chest Port 1 View  Result Date: 09/09/2022 CLINICAL DATA:  Respiratory failure. EXAM: PORTABLE CHEST 1 VIEW COMPARISON:  09/08/2022 FINDINGS: Feeding tube extends into  the abdomen but the tip is beyond the image. Right arm PICC line tip is near the superior cavoatrial junction is stable. Mild elevation of the right hemidiaphragm. Slightly improved aeration of the left lung base compared to the recent comparison examination. Low lung volumes with probable bibasilar atelectasis. No new airspace disease or consolidation. No overt pulmonary edema. Heart size is stable and minimally changed. IMPRESSION: Low lung volumes with bibasilar atelectasis. Slightly improved aeration at the left lung base compared to the recent comparison examination.  Electronically Signed   By: Richarda Overlie M.D.   On: 09/09/2022 12:08   DG Abd Portable 1V  Result Date: 09/08/2022 CLINICAL DATA:  NG tube placement.  Ileus.  SBO. EXAM: PORTABLE ABDOMEN - 1 VIEW COMPARISON:  KUB 09/03/2022. FINDINGS: The enteric catheter tip terminates in the distal stomach in the region of the pylorus. There is a relative paucity of bowel gas in the abdomen without evidence of mechanical obstruction. There is no definite free intraperitoneal air, within the confines of supine . IMPRESSION: Enteric catheter tip in the distal stomach in the region of the pylorus. Electronically Signed   By: Lesia Hausen M.D.   On: 09/08/2022 11:29   DG CHEST PORT 1 VIEW  Result Date: 09/08/2022 CLINICAL DATA:  Aspiration pneumonia EXAM: PORTABLE CHEST 1 VIEW COMPARISON:  Chest radiograph 09/02/2022. FINDINGS: The enteric catheter courses below the field of view. The right upper extremity PICC tip is stable terminating in the region of the cavoatrial junction. The cardiomediastinal silhouette is grossly stable. There are probable small bilateral pleural effusions with bibasilar airspace opacities, left worse than right. Overall, aeration is unchanged. There is no new or worsening focal airspace disease. There is no appreciable pneumothorax There is no acute osseous abnormality. IMPRESSION: Probable small bilateral pleural effusions with adjacent airspace opacities, left worse than right, unchanged. No new or worsening focal airspace disease. Electronically Signed   By: Lesia Hausen M.D.   On: 09/08/2022 11:28   IR Paracentesis  Result Date: 09/04/2022 INDICATION: 62 year old male with history of cirrhosis with ascites. Request made for paracentesis EXAM: ULTRASOUND GUIDED DIAGNOSTIC AND THERAPEUTIC PARACENTESIS MEDICATIONS: 10 mL 1% lidocaine. COMPLICATIONS: None immediate. PROCEDURE: Informed written consent was obtained from the patient after a discussion of the risks, benefits and alternatives to  treatment. A timeout was performed prior to the initiation of the procedure. Initial ultrasound scanning demonstrates a large amount of ascites within the right lower abdominal quadrant. The right lower abdomen was prepped and draped in the usual sterile fashion. 1% lidocaine with epinephrine was used for local anesthesia. Following this, a 19 gauge, 7-cm, Yueh catheter was introduced. An ultrasound image was saved for documentation purposes. The paracentesis was performed. The catheter was removed and a dressing was applied. The patient tolerated the procedure well without immediate post procedural complication. FINDINGS: A total of approximately 8.5 liters of dark, yellow fluid was removed. Samples were sent to the laboratory as requested by the clinical team. IMPRESSION: Successful ultrasound-guided paracentesis yielding 8.5 liters of peritoneal fluid. Read by: Loyce Dys PA-C Electronically Signed   By: Gilmer Mor D.O.   On: 09/04/2022 16:33   DG Abd Portable 1V  Result Date: 09/03/2022 CLINICAL DATA:  Feeding tube placement. EXAM: PORTABLE ABDOMEN - 1 VIEW COMPARISON:  09/01/2022 FINDINGS: Single view of the upper abdomen shows a feeding catheter in place with tip position in the distal stomach near the pylorus. Bowel gas pattern is nonspecific. IMPRESSION: Feeding catheter tip projects over  the distal stomach near the pylorus and is directed distally towards the duodenum. Electronically Signed   By: Kennith Center M.D.   On: 09/03/2022 12:16   DG CHEST PORT 1 VIEW  Result Date: 09/02/2022 CLINICAL DATA:  Respiratory failure EXAM: PORTABLE CHEST 1 VIEW COMPARISON:  September 01, 2022 FINDINGS: The NG tube terminates below today's film. A right PICC line is in good position. Probable layering effusion with underlying opacity in left base. Probable atelectasis in the right base. No pneumothorax. No change in the cardiomediastinal silhouette. IMPRESSION: 1. Support apparatus as above. 2. Probable  layering effusion with underlying opacity in the left base. 3. Probable atelectasis in the right base. Electronically Signed   By: Gerome Sam III M.D.   On: 09/02/2022 16:15   DG Chest 1 View  Result Date: 09/01/2022 CLINICAL DATA:  Respiratory failure. Impaired nasogastric feeding tube. EXAM: CHEST  1 VIEW COMPARISON:  05/26/2022 FINDINGS: Right central venous catheter with tip over the cavoatrial junction region. No pneumothorax. Enteric tube is present. Tip is off the field of view but below the left hemidiaphragm. Shallow inspiration with atelectasis in the lung bases. No pleural effusions. No pneumothorax. Mediastinal contours appear intact. Calcification of the aorta. Degenerative changes in the spine. IMPRESSION: 1. Appliances appear in satisfactory location. Shallow inspiration with atelectasis in the lung bases. Electronically Signed   By: Burman Nieves M.D.   On: 09/01/2022 22:47   DG Abd Portable 1V  Result Date: 09/01/2022 CLINICAL DATA:  Impaired nasogastric feeding tube. EXAM: PORTABLE ABDOMEN - 1 VIEW COMPARISON:  CT 07/20/2022 FINDINGS: An enteric tube is in place with tip in the right upper quadrant consistent with location in the distal stomach or possibly proximal duodenal. Lung bases are clear. IMPRESSION: Enteric tube tip projects over the right upper quadrant consistent with location in the distal stomach or proximal duodenal. Electronically Signed   By: Burman Nieves M.D.   On: 09/01/2022 22:46    Labs:  CBC: Recent Labs    09/17/22 0152 09/19/22 0220 09/20/22 0545 09/24/22 0155  WBC 4.6 4.8 5.8 6.3  HGB 7.6* 7.0* 8.5* 8.7*  HCT 26.2* 24.6* 28.9* 29.3*  PLT 74* 69* 74* 72*    COAGS: Recent Labs    05/30/22 0353 09/01/22 2228 09/10/22 0127 09/23/22 0805  INR 1.7* 1.5* 1.4* 1.5*  APTT 49* 31  --   --     BMP: Recent Labs    09/19/22 0220 09/20/22 0545 09/22/22 1123 09/24/22 0155  NA 151* 148* 144 140  K 3.8 3.8 4.7 3.9  CL 117* 113* 111 109   CO2 27 26 25 24   GLUCOSE 151* 246* 303* 188*  BUN 100* 100* 104* 102*  CALCIUM 9.1 8.9 8.4* 8.3*  CREATININE 0.53* 0.57* 0.63 0.65  GFRNONAA >60 >60 >60 >60    LIVER FUNCTION TESTS: Recent Labs    09/19/22 0220 09/20/22 0545 09/22/22 1123 09/24/22 0155  BILITOT 0.8 1.0 1.6* 1.3*  AST 139* 59* 243* 162*  ALT 105* 71* 162* 156*  ALKPHOS 758* 773* 1,385* 1,354*  PROT 4.6* 4.8* 4.9* 4.7*  ALBUMIN 2.5* 2.5* 2.4* 2.1*    TUMOR MARKERS: No results for input(s): "AFPTM", "CEA", "CA199", "CHROMGRNA" in the last 8760 hours.  Assessment and Plan:  Hepatic/etoh Cirrhosis Recurrent/refractory ascites Many large volume paracentesis --- 5 in last month ~8 liters each time Request made for consideration of TIPs procedure in IR I will discuss with Dr Loreta Ave today  Dr Elby Showers also aware of this  pt Pt and family has agreed to move ahead with TIPS at this point Scheduler working on IR and anesthesia timing Faily is aware of plans underway for procedure---- cannot promise timing until we hear from schedulers. Plans are in motion--- we will contact MD and family asap.    Thank you for this interesting consult.  I greatly enjoyed meeting Corey Silva and look forward to participating in their care.  A copy of this report was sent to the requesting provider on this date.  Electronically Signed: Robet Leu, PA-C 09/27/2022, 10:51 AM   I spent a total of 40 Minutes    in face to face in clinical consultation, greater than 50% of which was counseling/coordinating care for planning forTIPs procedure

## 2022-09-27 NOTE — Progress Notes (Signed)
    Cesar NP in [facility] has called IR to inform us that pt is willing to move ahead with TIPS procedure at this point.  Pt will be seen today for further discussion and to start the planning process for TIPs procedure. We will see pt in formal consult; arrange timing with anesthesia and IR availability. Procedure likely next week .  I had discussion with wife yesterday and she was aware that if pt decided to move forward with TIPs- TIPs would not be done today or possibly even this week Maureen Ralphs NP is aware of TIPs in planning at this time We will let [facility] MD  and pt know timing ASAP

## 2022-09-28 LAB — PHOSPHORUS: Phosphorus: 2.8 mg/dL (ref 2.5–4.6)

## 2022-09-28 LAB — AEROBIC/ANAEROBIC CULTURE W GRAM STAIN (SURGICAL/DEEP WOUND)

## 2022-09-28 LAB — MAGNESIUM: Magnesium: 2 mg/dL (ref 1.7–2.4)

## 2022-09-30 LAB — CBC
HCT: 24.6 % — ABNORMAL LOW (ref 39.0–52.0)
Hemoglobin: 7.9 g/dL — ABNORMAL LOW (ref 13.0–17.0)
MCH: 28.3 pg (ref 26.0–34.0)
MCHC: 32.1 g/dL (ref 30.0–36.0)
MCV: 88.2 fL (ref 80.0–100.0)
Platelets: 139 10*3/uL — ABNORMAL LOW (ref 150–400)
RBC: 2.79 MIL/uL — ABNORMAL LOW (ref 4.22–5.81)
RDW: 16.5 % — ABNORMAL HIGH (ref 11.5–15.5)
WBC: 5.9 10*3/uL (ref 4.0–10.5)
nRBC: 0 % (ref 0.0–0.2)

## 2022-09-30 LAB — COMPREHENSIVE METABOLIC PANEL
ALT: 85 U/L — ABNORMAL HIGH (ref 0–44)
ALT: 84 U/L — ABNORMAL HIGH (ref 0–44)
AST: 90 U/L — ABNORMAL HIGH (ref 15–41)
AST: 86 U/L — ABNORMAL HIGH (ref 15–41)
Albumin: 1.7 g/dL — ABNORMAL LOW (ref 3.5–5.0)
Albumin: 1.8 g/dL — ABNORMAL LOW (ref 3.5–5.0)
Alkaline Phosphatase: 1105 U/L — ABNORMAL HIGH (ref 38–126)
Alkaline Phosphatase: 1131 U/L — ABNORMAL HIGH (ref 38–126)
Anion gap: 6 (ref 5–15)
Anion gap: 8 (ref 5–15)
BUN: 131 mg/dL — ABNORMAL HIGH (ref 8–23)
BUN: 138 mg/dL — ABNORMAL HIGH (ref 8–23)
CO2: 16 mmol/L — ABNORMAL LOW (ref 22–32)
CO2: 13 mmol/L — ABNORMAL LOW (ref 22–32)
Calcium: 7.5 mg/dL — ABNORMAL LOW (ref 8.9–10.3)
Calcium: 7.8 mg/dL — ABNORMAL LOW (ref 8.9–10.3)
Chloride: 97 mmol/L — ABNORMAL LOW (ref 98–111)
Chloride: 98 mmol/L (ref 98–111)
Creatinine, Ser: 1.07 mg/dL (ref 0.61–1.24)
Creatinine, Ser: 1.15 mg/dL (ref 0.61–1.24)
GFR, Estimated: >60 mL/min (ref >60)
GFR, Estimated: >60 mL/min (ref >60)
Glucose, Bld: 225 mg/dL — ABNORMAL HIGH (ref 70–99)
Glucose, Bld: 231 mg/dL — ABNORMAL HIGH (ref 70–99)
Potassium: 3.3 mmol/L — ABNORMAL LOW (ref 3.5–5.1)
Potassium: 3.5 mmol/L (ref 3.5–5.1)
Sodium: 119 mmol/L — CL (ref 135–145)
Sodium: 119 mmol/L — CL (ref 135–145)
Total Bilirubin: 1 mg/dL (ref 0.3–1.2)
Total Bilirubin: 0.8 mg/dL (ref 0.3–1.2)
Total Protein: 4.2 g/dL — ABNORMAL LOW (ref 6.5–8.1)
Total Protein: 4.6 g/dL — ABNORMAL LOW (ref 6.5–8.1)

## 2022-09-30 LAB — AEROBIC/ANAEROBIC CULTURE W GRAM STAIN (SURGICAL/DEEP WOUND): Gram Stain: NONE SEEN

## 2022-09-30 LAB — POTASSIUM: Potassium: 3.3 mmol/L — ABNORMAL LOW (ref 3.5–5.1)

## 2022-09-30 LAB — AMMONIA: Ammonia: 50 umol/L — ABNORMAL HIGH (ref 9–35)

## 2022-10-01 LAB — POTASSIUM: Potassium: 3.7 mmol/L (ref 3.5–5.1)

## 2022-10-01 LAB — PHOSPHORUS: Phosphorus: 1.9 mg/dL — ABNORMAL LOW (ref 2.5–4.6)

## 2022-10-01 LAB — MAGNESIUM: Magnesium: 1.7 mg/dL (ref 1.7–2.4)

## 2022-10-02 LAB — BASIC METABOLIC PANEL
Anion gap: 10 (ref 5–15)
BUN: 136 mg/dL — ABNORMAL HIGH (ref 8–23)
CO2: 12 mmol/L — ABNORMAL LOW (ref 22–32)
Calcium: 8.2 mg/dL — ABNORMAL LOW (ref 8.9–10.3)
Chloride: 101 mmol/L (ref 98–111)
Creatinine, Ser: 0.92 mg/dL (ref 0.61–1.24)
GFR, Estimated: >60 mL/min (ref >60)
Glucose, Bld: 225 mg/dL — ABNORMAL HIGH (ref 70–99)
Potassium: 3.7 mmol/L (ref 3.5–5.1)
Sodium: 123 mmol/L — ABNORMAL LOW (ref 135–145)

## 2022-10-03 NOTE — Progress Notes (Signed)
Pt has been followed by IR service for TIPS consideration. Has refused workup including cardiac echo. Has refused paracentesis today.  Will no longer follow for TIPS consideration. IR able to perform therapeutic paracentesis as needed if/when pt agreeable.  Brayton El PA-C Interventional Radiology 10/03/2022 10:56 AM

## 2022-10-04 LAB — MAGNESIUM: Magnesium: 2 mg/dL (ref 1.7–2.4)

## 2022-10-04 LAB — PHOSPHORUS: Phosphorus: 2.7 mg/dL (ref 2.5–4.6)

## 2022-10-04 LAB — TRIGLYCERIDES: Triglycerides: 26 mg/dL (ref <150)
